# Patient Record
Sex: Female | Born: 1970 | Race: Black or African American | Hispanic: No | Marital: Married | Smoking: Never smoker
Health system: Southern US, Community
[De-identification: ages and names within clinical notes are randomized; demographics above are authoritative.]

## PROBLEM LIST (undated history)

## (undated) DIAGNOSIS — R0602 Shortness of breath: Secondary | ICD-10-CM

## (undated) DIAGNOSIS — J45909 Unspecified asthma, uncomplicated: Secondary | ICD-10-CM

## (undated) DIAGNOSIS — C801 Malignant (primary) neoplasm, unspecified: Secondary | ICD-10-CM

## (undated) DIAGNOSIS — I10 Essential (primary) hypertension: Secondary | ICD-10-CM

## (undated) HISTORY — PX: CHOLECYSTECTOMY: SHX55

## (undated) HISTORY — PX: TUBAL LIGATION: SHX77

---

## 1998-02-02 ENCOUNTER — Encounter: Admission: RE | Admit: 1998-02-02 | Discharge: 1998-02-02 | Payer: Self-pay | Admitting: Family Medicine

## 1998-02-15 ENCOUNTER — Encounter: Admission: RE | Admit: 1998-02-15 | Discharge: 1998-02-15 | Payer: Self-pay | Admitting: Family Medicine

## 1999-04-14 ENCOUNTER — Emergency Department (HOSPITAL_COMMUNITY): Admission: EM | Admit: 1999-04-14 | Discharge: 1999-04-14 | Payer: Self-pay | Admitting: Emergency Medicine

## 1999-08-01 ENCOUNTER — Encounter: Admission: RE | Admit: 1999-08-01 | Discharge: 1999-08-01 | Payer: Self-pay | Admitting: Family Medicine

## 1999-08-09 ENCOUNTER — Encounter: Admission: RE | Admit: 1999-08-09 | Discharge: 1999-08-09 | Payer: Self-pay | Admitting: Family Medicine

## 1999-08-09 ENCOUNTER — Other Ambulatory Visit: Admission: RE | Admit: 1999-08-09 | Discharge: 1999-08-09 | Payer: Self-pay | Admitting: Family Medicine

## 2000-01-08 ENCOUNTER — Emergency Department (HOSPITAL_COMMUNITY): Admission: EM | Admit: 2000-01-08 | Discharge: 2000-01-08 | Payer: Self-pay | Admitting: Emergency Medicine

## 2000-01-08 ENCOUNTER — Encounter: Payer: Self-pay | Admitting: Emergency Medicine

## 2001-01-31 ENCOUNTER — Emergency Department (HOSPITAL_COMMUNITY): Admission: EM | Admit: 2001-01-31 | Discharge: 2001-01-31 | Payer: Self-pay | Admitting: Emergency Medicine

## 2002-05-19 DIAGNOSIS — C801 Malignant (primary) neoplasm, unspecified: Secondary | ICD-10-CM

## 2002-05-19 HISTORY — DX: Malignant (primary) neoplasm, unspecified: C80.1

## 2003-02-14 ENCOUNTER — Emergency Department (HOSPITAL_COMMUNITY): Admission: EM | Admit: 2003-02-14 | Discharge: 2003-02-14 | Payer: Self-pay | Admitting: Emergency Medicine

## 2003-07-05 ENCOUNTER — Encounter: Payer: Self-pay | Admitting: Cardiology

## 2003-07-05 ENCOUNTER — Ambulatory Visit (HOSPITAL_COMMUNITY): Admission: RE | Admit: 2003-07-05 | Discharge: 2003-07-05 | Payer: Self-pay | Admitting: Family Medicine

## 2007-10-26 ENCOUNTER — Emergency Department (HOSPITAL_COMMUNITY): Admission: EM | Admit: 2007-10-26 | Discharge: 2007-10-26 | Payer: Self-pay | Admitting: Emergency Medicine

## 2007-11-07 ENCOUNTER — Emergency Department (HOSPITAL_COMMUNITY): Admission: EM | Admit: 2007-11-07 | Discharge: 2007-11-07 | Payer: Self-pay | Admitting: Emergency Medicine

## 2008-02-18 ENCOUNTER — Emergency Department (HOSPITAL_COMMUNITY): Admission: EM | Admit: 2008-02-18 | Discharge: 2008-02-18 | Payer: Self-pay | Admitting: Emergency Medicine

## 2009-02-17 ENCOUNTER — Emergency Department (HOSPITAL_COMMUNITY): Admission: EM | Admit: 2009-02-17 | Discharge: 2009-02-17 | Payer: Self-pay | Admitting: Emergency Medicine

## 2009-02-19 ENCOUNTER — Emergency Department (HOSPITAL_COMMUNITY): Admission: EM | Admit: 2009-02-19 | Discharge: 2009-02-19 | Payer: Self-pay | Admitting: Family Medicine

## 2009-03-06 ENCOUNTER — Emergency Department (HOSPITAL_COMMUNITY): Admission: EM | Admit: 2009-03-06 | Discharge: 2009-03-06 | Payer: Self-pay | Admitting: Emergency Medicine

## 2009-04-29 ENCOUNTER — Emergency Department (HOSPITAL_COMMUNITY): Admission: EM | Admit: 2009-04-29 | Discharge: 2009-04-29 | Payer: Self-pay | Admitting: Emergency Medicine

## 2011-02-13 LAB — RAPID STREP SCREEN (MED CTR MEBANE ONLY): Streptococcus, Group A Screen (Direct): NEGATIVE

## 2011-11-11 ENCOUNTER — Emergency Department (HOSPITAL_COMMUNITY)
Admission: EM | Admit: 2011-11-11 | Discharge: 2011-11-11 | Disposition: A | Discharge status: Home / Self Care | Payer: Self-pay | Attending: Emergency Medicine | Admitting: Emergency Medicine

## 2011-11-11 ENCOUNTER — Encounter (HOSPITAL_COMMUNITY): Payer: Self-pay | Admitting: Adult Health

## 2011-11-11 DIAGNOSIS — S46019A Strain of muscle(s) and tendon(s) of the rotator cuff of unspecified shoulder, initial encounter: Secondary | ICD-10-CM

## 2011-11-11 DIAGNOSIS — S43429A Sprain of unspecified rotator cuff capsule, initial encounter: Secondary | ICD-10-CM | POA: Insufficient documentation

## 2011-11-11 DIAGNOSIS — Y9389 Activity, other specified: Secondary | ICD-10-CM | POA: Insufficient documentation

## 2011-11-11 DIAGNOSIS — Y998 Other external cause status: Secondary | ICD-10-CM | POA: Insufficient documentation

## 2011-11-11 DIAGNOSIS — S46819A Strain of other muscles, fascia and tendons at shoulder and upper arm level, unspecified arm, initial encounter: Secondary | ICD-10-CM | POA: Insufficient documentation

## 2011-11-11 DIAGNOSIS — X58XXXA Exposure to other specified factors, initial encounter: Secondary | ICD-10-CM | POA: Insufficient documentation

## 2011-11-11 MED ORDER — NAPROXEN 500 MG PO TABS: 500.0000 mg | ORAL_TABLET | Freq: Two times a day (BID) | ORAL | Status: DC

## 2011-11-11 NOTE — ED Provider Notes (Signed)
History     CSN: [id number]  Arrival date & time 11/11/11  1925   First MD Initiated Contact with Patient 11/11/11 2057      Chief Complaint  Patient presents with  . Shoulder Pain   HPI  History provided by the patient. Patient is a 41 year old female with no significant past medical history who presents with complaints of right shoulder pain. Patient states that pain began last Tuesday acutely after reaching forward for a bottle. Patient states she felt a slight pop at that time. Pain has been waxing and waning and is worse with movements. Patient has been trying to rest shoulder and put heat over the area. She has slight improvement of symptoms on Sunday but then has had return of pain. Pain occasionally radiates down upper arm towards the elbow area. She denies any numbness or weakness in the arm. Patient denies any previous injuries to the shoulder. Patient denies any neck pain or injury. Symptoms are described as moderate to severe. Patient denies any other associated symptoms. She denies any other aggravating or alleviating factors.    History reviewed. No pertinent past medical history.  History reviewed. No pertinent past surgical history.  History reviewed. No pertinent family history.  History  Substance Use Topics  . Smoking status: Never Smoker   . Smokeless tobacco: Not on file  . Alcohol Use: No    OB History    Grav Para Term Preterm Abortions TAB SAB Ect Mult Living                  Review of Systems  HENT: Negative for neck pain.   Musculoskeletal:       Right shoulder pain   Neurological: Negative for weakness, numbness and headaches.    Allergies  Review of patient's allergies indicates no known allergies.  Home Medications  No current outpatient prescriptions on file.  BP 166/107  Pulse 85  Temp 98.6 F (37 C) (Oral)  Resp 16  SpO2 100%  Physical Exam  Nursing note and vitals reviewed. Constitutional: She is oriented to person, place,  and time. She appears well-developed and well-nourished. No distress.  HENT:  Head: Normocephalic.  Neck: Normal range of motion. Neck supple.       No cervical midline tenderness.  Cardiovascular: Normal rate and regular rhythm.   Pulmonary/Chest: Effort normal and breath sounds normal.  Musculoskeletal: She exhibits tenderness. She exhibits no edema.       Tenderness along the right trapezius and supraspinatus muscle. Mild tenderness over a.c. joint. No deformities or pain along clavicle. Pain with abduction of shoulder. Normal flexion and extension.  Normal distal radial pulses, strength and forearm and hand, normal sensation in fingers.  Neurological: She is alert and oriented to person, place, and time.  Skin: Skin is warm and dry. No erythema.  Psychiatric: She has a normal mood and affect. Her behavior is normal.    ED Course  Procedures     1. Rotator cuff strain   2. Supraspinatus sprain       MDM  10:40 PM patient seen and evaluated. Patient no acute distress.        Angus Seller, [place] 11/12/11 [phone]

## 2011-11-11 NOTE — Discharge Instructions (Signed)
You were seen and evaluated today for your right shoulder pains. Your providers today feel that you have muscle and tendon strain in your shoulder. Please use rest, ice, compression and elevation to help reduce pain and swelling symptoms. Use ibuprofen or Aleve to help with pain and inflammation. Please followup to primary care provider or orthopedic specialist for continued evaluation and treatment.    Muscle Strain A muscle strain (pulled muscle) happens when a muscle is over-stretched. Recovery usually takes 5 to 6 weeks.  HOME CARE   Put ice on the injured area.   Put ice in a plastic bag.   Place a towel between your skin and the bag.   Leave the ice on for 15 to 20 minutes at a time, every hour for the first 2 days.   Do not use the muscle for several days or until your doctor says you can. Do not use the muscle if you have pain.   Wrap the injured area with an elastic bandage for comfort. Do not put it on too tightly.   Only take medicine as told by your doctor.   Warm up before exercise. This helps prevent muscle strains.  GET HELP RIGHT AWAY IF:  There is increased pain or puffiness (swelling) in the affected area. MAKE SURE YOU:   Understand these instructions.   Will watch your condition.   Will get help right away if you are not doing well or get worse.  Document Released: 02/12/2008 Document Revised: 04/24/2011 Document Reviewed: 02/12/2008 [facility] Patient Information 2012 [place], [place].    Rotator Cuff Tendonitis  The rotator cuff is the collection of all the muscles and tendons (the supraspinatus, infraspinatus, subscapularis, and teres minor muscles and their tendons) that help your shoulder stay in place. This unit holds the head of the upper arm bone (humerus) in the cup (fossa) of the shoulder blade (scapula). Basically, it connects the arm to the shoulder. Tendinitis is a swelling and irritation of the tissue, called cord like structures (tendons) that  connect muscle to bone. It usually is caused by overusing the joint involved. When the tissue surrounding a tendon (the synovium) becomes inflamed, it is called tenosynovitis. This also is often the result of overuse in people whose jobs require repetitive (over and over again) types of motion. HOME CARE INSTRUCTIONS   Use a sling or splint for as long as directed by your caregiver until the pain decreases.   Apply ice to the injury for 15 to 20 minutes, 3 to 4 times per day. Put the ice in a plastic bag and place a towel between the bag of ice and your skin.   Try to avoid use other than gentle range of motion while your shoulder is painful. Use and exercise only as directed by your caregiver. Stop exercises or range of motion if pain or discomfort increases, unless directed otherwise by your caregiver.   Only take over-the-counter or prescription medicines for pain, discomfort, or fever as directed by your caregiver.   If you were give a shoulder sling and straps (immobilizer), do not remove it except as directed, or until you see a caregiver for a follow-up examination. If you need to remove it, move your arm as little as possible or as directed.   You may want to sleep on several pillows at night to lessen swelling and pain.  SEEK IMMEDIATE MEDICAL CARE IF:   Pain in your shoulder increases or new pain develops in your arm, hand, or  fingers and is not relieved with medications.   You develop new, unexplained symptoms, especially increased numbness in the hands or loss of strength, or you develop any worsening of the problems which brought you in for care.   Your arm, hand, or fingers are numb or tingling.   Your arm, hand, or fingers are swollen, painful, or turn white or blue.  Document Released: 07/26/2003 Document Revised: 04/24/2011 Document Reviewed: 03/02/2008 [facility] Patient Information 2012 [place], [place].

## 2011-11-11 NOTE — ED Notes (Signed)
C/o right shoulder pain that radiates down to elbow and is worse with movement, began last Tuesday and eased off on Friday then came back on Sunday. 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 Pain is better with rest and propping arm up. Pain is intermittent and described as sharp. CMS intact.

## 2011-11-12 NOTE — ED Provider Notes (Signed)
Medical screening examination/treatment/procedure(s) were performed by non-physician practitioner and as supervising physician I was immediately available for consultation/collaboration.   Krimson Massmann, MD 11/12/11 1538 

## 2012-01-31 ENCOUNTER — Emergency Department (HOSPITAL_COMMUNITY)
Admission: EM | Admit: 2012-01-31 | Discharge: 2012-01-31 | Disposition: A | Discharge status: Home / Self Care | Payer: Self-pay | Attending: Emergency Medicine | Admitting: Emergency Medicine

## 2012-01-31 ENCOUNTER — Encounter (HOSPITAL_COMMUNITY): Payer: Self-pay

## 2012-01-31 DIAGNOSIS — R21 Rash and other nonspecific skin eruption: Secondary | ICD-10-CM | POA: Insufficient documentation

## 2012-01-31 MED ORDER — HYDROCORTISONE 1 % EX CREA: TOPICAL_CREAM | CUTANEOUS | Status: DC

## 2012-01-31 NOTE — ED Provider Notes (Signed)
History  Scribed for Ethelda Chick, MD, the patient was seen in room TR11C/TR11C. This chart was scribed by Candelaria Stagers. The patient's care started at 2:26 PM   CSN: [id number]  Arrival date & time 01/31/12  1230   First MD Initiated Contact with Patient 01/31/12 1359      Chief Complaint  Patient presents with  . Rash     The history is provided by the patient. No language interpreter was used.   Dawn Mitchell is a 41 y.o. female who presents to the Emergency Department complaining of a rash to the right side of her neck that started about one month ago and has recently gotten worse and is now painful.  She reports when the rash first started it itched.  Nothing seems to make the rash better or worse. She has tried vaseline and rubbing alcohol.  No fever, no overlying redness, no pus draining.  She denies any new exposures.  There are no other associated systemic symptoms, there are no other alleviating or modifying factors.   No past medical history on file.  No past surgical history on file.  No family history on file.  History  Substance Use Topics  . Smoking status: Never Smoker   . Smokeless tobacco: Not on file  . Alcohol Use: No    OB History    Grav Para Term Preterm Abortions TAB SAB Ect Mult Living                  Review of Systems  Skin: Positive for rash (burning, painful rash to right side of neck).  All other systems reviewed and are negative.    Allergies  Review of patient's allergies indicates no known allergies.  Home Medications   Current Outpatient Rx  Name Route Sig Dispense Refill  . HYDROCORTISONE 1 % EX CREA  Apply to affected area 3 times daily 15 g 0    BP 171/105  Pulse 88  Temp 98.4 F (36.9 C) (Oral)  Resp 16  SpO2 100%  Physical Exam  Nursing note and vitals reviewed. Constitutional: She is oriented to person, place, and time. She appears well-developed and well-nourished. No distress.  HENT:  Head: Normocephalic  and atraumatic.  Eyes: EOM are normal. Pupils are equal, round, and reactive to light.  Neck: Neck supple. No tracheal deviation present.  Pulmonary/Chest: Effort normal. No respiratory distress.  Musculoskeletal: Normal range of motion. She exhibits no edema.  Neurological: She is alert and oriented to person, place, and time.  Skin: Rash noted.       5 cm area on right neck that is dry with flesh colored papules.  No erythema, no vesicles.   Psychiatric: She has a normal mood and affect. Her behavior is normal.    ED Course  Procedures   DIAGNOSTIC STUDIES: Oxygen Saturation is 100% on room air, normal by my interpretation.    COORDINATION OF CARE:     Labs Reviewed - No data to display No results found.   1. Rash       MDM  Pt presenting with rash overlying the right side of her neck- rash looks most c/w exzcema- does not have vesicles or pustules, no overlying erythema to suggest infection.  Not c/w zoster rash.  Advised hydrocortisone cream.  Pt is overall nontoxic and well hydrated in appearance.  Discharged with strict return precautions.  Pt agreeable with plan.  I personally performed the services described in this documentation, which  was scribed in my presence. The recorded information has been reviewed and considered.        Ethelda Chick, MD 02/01/12 1224

## 2012-01-31 NOTE — ED Notes (Signed)
Pt complains of rash to neck on right side x 1 month, sts she thinks it is shingles or heat bumps. Burns and itches.

## 2012-02-21 ENCOUNTER — Emergency Department (HOSPITAL_COMMUNITY)
Admission: EM | Admit: 2012-02-21 | Discharge: 2012-02-21 | Disposition: A | Discharge status: Home / Self Care | Payer: Self-pay | Attending: Emergency Medicine | Admitting: Emergency Medicine

## 2012-02-21 ENCOUNTER — Encounter (HOSPITAL_COMMUNITY): Payer: Self-pay | Admitting: Nurse Practitioner

## 2012-02-21 DIAGNOSIS — H60399 Other infective otitis externa, unspecified ear: Secondary | ICD-10-CM | POA: Insufficient documentation

## 2012-02-21 DIAGNOSIS — H6000 Abscess of external ear, unspecified ear: Secondary | ICD-10-CM

## 2012-02-21 MED ORDER — SULFAMETHOXAZOLE-TRIMETHOPRIM 800-160 MG PO TABS: 1.0000 | ORAL_TABLET | Freq: Two times a day (BID) | ORAL | Status: DC

## 2012-02-21 MED ORDER — CEPHALEXIN 500 MG PO CAPS: 500.0000 mg | ORAL_CAPSULE | Freq: Four times a day (QID) | ORAL | Status: DC

## 2012-02-21 MED ORDER — ACETIC ACID 2 % OT SOLN: 4.0000 [drp] | Freq: Three times a day (TID) | OTIC | Status: DC

## 2012-02-21 MED ORDER — HYDROCODONE-ACETAMINOPHEN 5-325 MG PO TABS: 1.0000 | ORAL_TABLET | ORAL | Status: DC | PRN

## 2012-02-21 MED ORDER — HYDROCHLOROTHIAZIDE 25 MG PO TABS: 25.0000 mg | ORAL_TABLET | Freq: Every day | ORAL | Status: DC

## 2012-02-21 NOTE — ED Provider Notes (Signed)
History     CSN: [id number]  Arrival date & time 02/21/12  1802   First MD Initiated Contact with Patient 02/21/12 2133      Chief Complaint  Patient presents with  . Otalgia    (Consider location/radiation/quality/duration/timing/severity/associated sxs/prior treatment) HPI History provided by pt.   Pt presents w/ severe left inner ear pain x 3 days.  Has noticed a small amt of purulent/bloody drainage.  No associated fever, nasal congestion, rhinorrhea, sore throat or cough.  Denies trauma.  Has been using otc analgesic ear drops w/out relief.  No PMH.    History reviewed. No pertinent past medical history.  History reviewed. No pertinent past surgical history.  History reviewed. No pertinent family history.  History  Substance Use Topics  . Smoking status: Never Smoker   . Smokeless tobacco: Not on file  . Alcohol Use: No    OB History    Grav Para Term Preterm Abortions TAB SAB Ect Mult Living                  Review of Systems  All other systems reviewed and are negative.    Allergies  Review of patient's allergies indicates no known allergies.  Home Medications   Current Outpatient Rx  Name Route Sig Dispense Refill  . HYDROCORTISONE 1 % EX CREA  Apply to affected area 3 times daily 15 g 0    BP 177/102  Pulse 92  Temp 98.4 F (36.9 C) (Oral)  Resp 16  SpO2 100%  Physical Exam  Nursing note and vitals reviewed. Constitutional: She is oriented to person, place, and time. She appears well-developed and well-nourished. No distress.  HENT:  Head: Normocephalic and atraumatic.       No tenderness, erythema or edema of right mastoid.  Mild tenderness posterior pinna.  There is an approx 1cm diameter abscess at most outer and posterior aspect of external auditory canal.  No visible active drainage.  Ttp.  The rest of inner ear exam limited d/t abscess but there is no obvious edema, erythema or drainage in the rest of canal.  TM appears normal.    Eyes:         Normal appearance  Neck: Normal range of motion.  Cardiovascular: Normal rate and regular rhythm.        hypertensive  Pulmonary/Chest: Effort normal and breath sounds normal. No respiratory distress.  Musculoskeletal: Normal range of motion.  Lymphadenopathy:    She has no cervical adenopathy.  Neurological: She is alert and oriented to person, place, and time.  Skin: Skin is warm and dry. No rash noted.  Psychiatric: She has a normal mood and affect. Her behavior is normal.    ED Course  Procedures (including critical care time)  Labs Reviewed - No data to display No results found.   1. Abscess of ear canal       MDM  Healthy 41yo F presents w/ c/o right ear pain.  Abscess at outermost portion of external auditory canal on exam.  No sign of mastoiditis.  I am not going to attempt I&D because it will be difficulty to access and I am concerned that I could injure patient if she should move.  Will prescribed keflex/bactrim as well as acetic acid otic solution (rest of ear exam limited and otitis externa unlikely but possible) and refer to ENT for worsening/refractory sx.  I have also prescribed her HCTZ for new onset HTN and referred to PCP.  She understands  the importance of BP control.          Arie Sabina [place], [place] 02/21/12 2203

## 2012-02-21 NOTE — ED Notes (Signed)
C/o R earache for past days. Trying OTC ear drops with no relief. Denies any other symptoms or complaints

## 2012-02-23 NOTE — ED Provider Notes (Signed)
Medical screening examination/treatment/procedure(s) were performed by non-physician practitioner and as supervising physician I was immediately available for consultation/collaboration.   Richardean Canal, MD 02/23/12 [phone]

## 2012-03-01 ENCOUNTER — Encounter (HOSPITAL_COMMUNITY): Payer: Self-pay | Admitting: Emergency Medicine

## 2012-03-01 ENCOUNTER — Emergency Department (HOSPITAL_COMMUNITY)
Admission: EM | Admit: 2012-03-01 | Discharge: 2012-03-01 | Disposition: A | Discharge status: Hospice | Payer: Self-pay | Attending: Emergency Medicine | Admitting: Emergency Medicine

## 2012-03-01 DIAGNOSIS — R110 Nausea: Secondary | ICD-10-CM | POA: Insufficient documentation

## 2012-03-01 DIAGNOSIS — Z79899 Other long term (current) drug therapy: Secondary | ICD-10-CM | POA: Insufficient documentation

## 2012-03-01 DIAGNOSIS — I10 Essential (primary) hypertension: Secondary | ICD-10-CM | POA: Insufficient documentation

## 2012-03-01 DIAGNOSIS — R10816 Epigastric abdominal tenderness: Secondary | ICD-10-CM | POA: Insufficient documentation

## 2012-03-01 DIAGNOSIS — R1013 Epigastric pain: Secondary | ICD-10-CM | POA: Insufficient documentation

## 2012-03-01 DIAGNOSIS — R51 Headache: Secondary | ICD-10-CM | POA: Insufficient documentation

## 2012-03-01 HISTORY — DX: Essential (primary) hypertension: I10

## 2012-03-01 LAB — BASIC METABOLIC PANEL
CO2: 28 mEq/L (ref 19–32)
Calcium: 10.7 mg/dL — ABNORMAL HIGH (ref 8.4–10.5)
Chloride: 97 mEq/L (ref 96–112)
Creatinine, Ser: 0.94 mg/dL (ref 0.50–1.10)
GFR calc Af Amer: 85 mL/min — ABNORMAL LOW (ref >90)
Sodium: 134 mEq/L — ABNORMAL LOW (ref 135–145)

## 2012-03-01 LAB — CBC WITH DIFFERENTIAL/PLATELET
Basophils Relative: 1 % (ref 0–1)
Eosinophils Absolute: 0.4 10*3/uL (ref 0.0–0.7)
Eosinophils Relative: 11 % — ABNORMAL HIGH (ref 0–5)
HCT: 32.1 % — ABNORMAL LOW (ref 36.0–46.0)
Hemoglobin: 9.7 g/dL — ABNORMAL LOW (ref 12.0–15.0)
Lymphocytes Relative: 44 % (ref 12–46)
Monocytes Relative: 15 % — ABNORMAL HIGH (ref 3–12)
Neutro Abs: 1 10*3/uL — ABNORMAL LOW (ref 1.7–7.7)
Neutrophils Relative %: 29 % — ABNORMAL LOW (ref 43–77)
RBC: 4.67 MIL/uL (ref 3.87–5.11)
Smear Review: DECREASED
WBC: 3.6 10*3/uL — ABNORMAL LOW (ref 4.0–10.5)

## 2012-03-01 LAB — URINALYSIS, ROUTINE W REFLEX MICROSCOPIC
Glucose, UA: NEGATIVE mg/dL
Ketones, ur: NEGATIVE mg/dL
Nitrite: NEGATIVE
Protein, ur: 30 mg/dL — AB
pH: 6 (ref 5.0–8.0)

## 2012-03-01 LAB — POCT PREGNANCY, URINE: Preg Test, Ur: NEGATIVE

## 2012-03-01 MED ORDER — FAMOTIDINE 20 MG PO TABS
20.0000 mg | ORAL_TABLET | Freq: Once | ORAL | Status: AC
  Administered 2012-03-01: 20 mg via ORAL
  Filled 2012-03-01: qty 1

## 2012-03-01 MED ORDER — OXYCODONE-ACETAMINOPHEN 5-325 MG PO TABS
1.0000 | ORAL_TABLET | Freq: Once | ORAL | Status: AC
  Administered 2012-03-01: 1 via ORAL
  Filled 2012-03-01: qty 1

## 2012-03-01 MED ORDER — FAMOTIDINE 40 MG PO TABS: 40.0000 mg | ORAL_TABLET | Freq: Every day | ORAL | Status: DC

## 2012-03-01 MED ORDER — HYDROCHLOROTHIAZIDE 25 MG PO TABS: 25.0000 mg | ORAL_TABLET | Freq: Every day | ORAL | Status: DC

## 2012-03-01 MED ORDER — OXYCODONE-ACETAMINOPHEN 5-325 MG PO TABS: ORAL_TABLET | ORAL | Status: DC

## 2012-03-01 MED ORDER — GI COCKTAIL ~~LOC~~
30.0000 mL | Freq: Once | ORAL | Status: AC
  Administered 2012-03-01: 30 mL via ORAL
  Filled 2012-03-01: qty 30

## 2012-03-01 MED ORDER — ONDANSETRON 4 MG PO TBDP
4.0000 mg | ORAL_TABLET | Freq: Once | ORAL | Status: AC
  Administered 2012-03-01: 4 mg via ORAL
  Filled 2012-03-01: qty 1

## 2012-03-01 NOTE — ED Notes (Signed)
Pt c/o upper abd pain with nausea x 4 days with generalized HA; pt sts stopped taking bp meds and antibiotics because she thought could be causing pain

## 2012-03-01 NOTE — ED Provider Notes (Signed)
Medical screening examination/treatment/procedure(s) were performed by non-physician practitioner and as supervising physician I was immediately available for consultation/collaboration.  Katheryn Culliton R. Leone Putman, MD 03/01/12 1541 

## 2012-03-01 NOTE — ED Provider Notes (Signed)
History     CSN: [id number]  Arrival date & time 03/01/12  1134   First MD Initiated Contact with Patient 03/01/12 1303      Chief Complaint  Patient presents with  . Abdominal Pain  . Headache    (Consider location/radiation/quality/duration/timing/severity/associated sxs/prior treatment) The history is provided by the patient.    41 y.o. female INAD 10/10 epigastric pain exacerbated by eating worsening over the course of 4 days. Patient reports mild frontal HA. nausea Denies decrease in by mouth intake fever, emesis, diarrhea, constipation, change in bladder habits. She self DC'd antibiotics and blood pressure medications because she assumed this was a side effect however symptoms have not improved since DC 4 days ago.  Past Medical History  Diagnosis Date  . Hypertension     History reviewed. No pertinent past surgical history.  History reviewed. No pertinent family history.  History  Substance Use Topics  . Smoking status: Never Smoker   . Smokeless tobacco: Not on file  . Alcohol Use: No    OB History    Grav Para Term Preterm Abortions TAB SAB Ect Mult Living                  Review of Systems  Constitutional: Negative for fever.  Respiratory: Negative for shortness of breath.   Cardiovascular: Negative for chest pain.  Gastrointestinal: Positive for nausea and abdominal pain. Negative for vomiting and diarrhea.  Neurological: Positive for headaches.  All other systems reviewed and are negative.    Allergies  Review of patient's allergies indicates no known allergies.  Home Medications   Current Outpatient Rx  Name Route Sig Dispense Refill  . ACETIC ACID 2 % OT SOLN Right Ear Place 4 drops into the right ear 3 (three) times daily. 15 mL 0  . HYDROCHLOROTHIAZIDE 25 MG PO TABS Oral Take 1 tablet (25 mg total) by mouth daily. 30 tablet 0  . HYDROCODONE-ACETAMINOPHEN 5-325 MG PO TABS Oral Take 1 tablet by mouth every 4 (four) hours as needed. For  pain.    Marland Kitchen HYDROCORTISONE 1 % EX CREA Topical Apply 1 application topically daily.    . ADULT MULTIVITAMIN W/MINERALS CH Oral Take 1 tablet by mouth daily.    . CEPHALEXIN 500 MG PO CAPS Oral Take 500 mg by mouth 4 (four) times daily. 7 day treatment. Started 10.6.13    . SULFAMETHOXAZOLE-TRIMETHOPRIM 800-160 MG PO TABS Oral Take 1 tablet by mouth 2 (two) times daily. 7 day treatment. Started on 02/22/12.      BP 148/88  Pulse 94  Temp 98.5 F (36.9 C) (Oral)  Resp 20  SpO2 100%  Physical Exam  Nursing note and vitals reviewed. Constitutional: She is oriented to person, place, and time. She appears well-developed and well-nourished. No distress.  HENT:  Head: Normocephalic.  Mouth/Throat: Oropharynx is clear and moist.  Eyes: Conjunctivae normal and EOM are normal. Pupils are equal, round, and reactive to light.  Cardiovascular: Normal rate, regular rhythm, normal heart sounds and intact distal pulses.   Pulmonary/Chest: Effort normal and breath sounds normal. No stridor. No respiratory distress. She has no wheezes. She has no rales. She exhibits no tenderness.  Abdominal:       Very mild tenderness to palpation of the epigastrium. No rebound or peritoneal signs.  Musculoskeletal: Normal range of motion. She exhibits no edema and no tenderness.  Neurological: She is alert and oriented to person, place, and time.  Psychiatric: She has a normal mood and  affect.    ED Course  Procedures (including critical care time)  Labs Reviewed  CBC WITH DIFFERENTIAL - Abnormal; Notable for the following:    WBC 3.6 (*)     Hemoglobin 9.7 (*)     HCT 32.1 (*)     MCV 68.7 (*)     MCH 20.8 (*)     RDW 18.7 (*)     Platelets 132 (*)     All other components within normal limits  BASIC METABOLIC PANEL - Abnormal; Notable for the following:    Sodium 134 (*)     Calcium 10.7 (*)     GFR calc non Af Amer 73 (*)     GFR calc Af Amer 85 (*)     All other components within normal limits    URINALYSIS, ROUTINE W REFLEX MICROSCOPIC - Abnormal; Notable for the following:    APPearance CLOUDY (*)     Protein, ur 30 (*)     Leukocytes, UA MODERATE (*)     All other components within normal limits  URINE MICROSCOPIC-ADD ON - Abnormal; Notable for the following:    Squamous Epithelial / LPF MANY (*)     Bacteria, UA FEW (*)     All other components within normal limits  POCT PREGNANCY, URINE   No results found.   1. Epigastric pain       MDM  41 y.o. female with nausea and epigastric pain, and after eating. Likely gastric ulcer/gastritis, H/ Pylori. Abdominal is exam is benign with no peritoneal signs. Blood work is unremarkable urinalysis may she's show signs of infection however patient is asymptomatic.  Counseled patient on obtaining outpatient care, and the importance of continuing with her hypertension medications.   Pt verbalized understanding and agrees with care plan. Outpatient follow-up and return precautions given.     New Prescriptions   FAMOTIDINE (PEPCID) 40 MG TABLET    Take 1 tablet (40 mg total) by mouth daily.   HYDROCHLOROTHIAZIDE (HYDRODIURIL) 25 MG TABLET    Take 1 tablet (25 mg total) by mouth daily.   OXYCODONE-ACETAMINOPHEN (PERCOCET/ROXICET) 5-325 MG PER TABLET    1 to 2 tabs PO q6hrs  PRN for pain          Wynetta Emery, PA-C 03/01/12 [address], PA-C 03/01/12 1452

## 2012-09-03 DIAGNOSIS — N390 Urinary tract infection, site not specified: Secondary | ICD-10-CM | POA: Insufficient documentation

## 2012-09-03 DIAGNOSIS — R300 Dysuria: Secondary | ICD-10-CM | POA: Insufficient documentation

## 2012-09-03 DIAGNOSIS — R3915 Urgency of urination: Secondary | ICD-10-CM | POA: Insufficient documentation

## 2012-09-03 DIAGNOSIS — Z3202 Encounter for pregnancy test, result negative: Secondary | ICD-10-CM | POA: Insufficient documentation

## 2012-09-03 DIAGNOSIS — I10 Essential (primary) hypertension: Secondary | ICD-10-CM | POA: Insufficient documentation

## 2012-09-03 DIAGNOSIS — D649 Anemia, unspecified: Secondary | ICD-10-CM | POA: Insufficient documentation

## 2012-09-03 DIAGNOSIS — R1013 Epigastric pain: Secondary | ICD-10-CM | POA: Insufficient documentation

## 2012-09-04 ENCOUNTER — Encounter (HOSPITAL_COMMUNITY): Payer: Self-pay | Admitting: Emergency Medicine

## 2012-09-04 ENCOUNTER — Emergency Department (HOSPITAL_COMMUNITY)
Admission: EM | Admit: 2012-09-04 | Discharge: 2012-09-04 | Disposition: A | Discharge status: Home / Self Care | Payer: BC Managed Care – PPO | Attending: Emergency Medicine | Admitting: Emergency Medicine

## 2012-09-04 DIAGNOSIS — N390 Urinary tract infection, site not specified: Secondary | ICD-10-CM

## 2012-09-04 DIAGNOSIS — R1013 Epigastric pain: Secondary | ICD-10-CM

## 2012-09-04 DIAGNOSIS — D649 Anemia, unspecified: Secondary | ICD-10-CM

## 2012-09-04 LAB — URINALYSIS, ROUTINE W REFLEX MICROSCOPIC
Bilirubin Urine: NEGATIVE
Glucose, UA: NEGATIVE mg/dL
Ketones, ur: NEGATIVE mg/dL
Nitrite: NEGATIVE
Protein, ur: 100 mg/dL — AB
Specific Gravity, Urine: 1.016 (ref 1.005–1.030)
Urobilinogen, UA: 1 mg/dL (ref 0.0–1.0)
pH: 7.5 (ref 5.0–8.0)

## 2012-09-04 LAB — CBC WITH DIFFERENTIAL/PLATELET
Basophils Absolute: 0 10*3/uL (ref 0.0–0.1)
Basophils Relative: 0 % (ref 0–1)
Eosinophils Absolute: 0.2 K/uL (ref 0.0–0.7)
Eosinophils Relative: 2 % (ref 0–5)
HCT: 28.8 % — ABNORMAL LOW (ref 36.0–46.0)
Hemoglobin: 8.9 g/dL — ABNORMAL LOW (ref 12.0–15.0)
Lymphocytes Relative: 25 % (ref 12–46)
Lymphs Abs: 2 K/uL (ref 0.7–4.0)
MCH: 21 pg — ABNORMAL LOW (ref 26.0–34.0)
MCHC: 30.9 g/dL (ref 30.0–36.0)
MCV: 67.9 fL — ABNORMAL LOW (ref 78.0–100.0)
Monocytes Absolute: 0.6 K/uL (ref 0.1–1.0)
Monocytes Relative: 7 % (ref 3–12)
Neutro Abs: 5.3 10*3/uL (ref 1.7–7.7)
Neutrophils Relative %: 66 % (ref 43–77)
Platelets: 252 K/uL (ref 150–400)
RBC: 4.24 MIL/uL (ref 3.87–5.11)
RDW: 16.3 % — ABNORMAL HIGH (ref 11.5–15.5)
WBC: 8.1 10*3/uL (ref 4.0–10.5)

## 2012-09-04 LAB — URINE MICROSCOPIC-ADD ON

## 2012-09-04 LAB — COMPREHENSIVE METABOLIC PANEL
Albumin: 3.8 g/dL (ref 3.5–5.2)
Alkaline Phosphatase: 78 U/L (ref 39–117)
BUN: 9 mg/dL (ref 6–23)
CO2: 27 mEq/L (ref 19–32)
Chloride: 103 mEq/L (ref 96–112)
GFR calc non Af Amer: >90 mL/min (ref >90)
Glucose, Bld: 103 mg/dL — ABNORMAL HIGH (ref 70–99)
Potassium: 3.3 mEq/L — ABNORMAL LOW (ref 3.5–5.1)
Total Bilirubin: 0.3 mg/dL (ref 0.3–1.2)

## 2012-09-04 LAB — COMPREHENSIVE METABOLIC PANEL WITH GFR
ALT: 11 U/L (ref 0–35)
AST: 14 U/L (ref 0–37)
Calcium: 9.8 mg/dL (ref 8.4–10.5)
Creatinine, Ser: 0.7 mg/dL (ref 0.50–1.10)
GFR calc Af Amer: >90 mL/min (ref >90)
Sodium: 138 meq/L (ref 135–145)
Total Protein: 8 g/dL (ref 6.0–8.3)

## 2012-09-04 LAB — POCT PREGNANCY, URINE: Preg Test, Ur: NEGATIVE

## 2012-09-04 LAB — OCCULT BLOOD, POC DEVICE: Fecal Occult Bld: NEGATIVE

## 2012-09-04 MED ORDER — CIPROFLOXACIN HCL 500 MG PO TABS: 500.0000 mg | ORAL_TABLET | Freq: Two times a day (BID) | ORAL | Status: DC

## 2012-09-04 MED ORDER — PANTOPRAZOLE SODIUM 40 MG PO TBEC: 40.0000 mg | DELAYED_RELEASE_TABLET | Freq: Every day | ORAL | Status: AC

## 2012-09-04 NOTE — ED Provider Notes (Signed)
History     CSN: [id number]  Arrival date & time 09/03/12  2358   First MD Initiated Contact with Patient 09/04/12 0448      Chief Complaint  Patient presents with  . Abdominal Pain    (Consider location/radiation/quality/duration/timing/severity/associated sxs/prior treatment) HPI Comments: Patient reports she has a history of a urinary tract infection, has had some lower abdominal discomfort, dysuria and urgency similar to prior episodes for the last 3 days. She denies any flank or back pain, nausea vomiting or fevers. She also tonight had intermittent severe crampy discomfort in her upper abdomen which currently is resolved. Patient was seen previously for the same symptoms, told that she may have reflux or early ulcer disease was put on medication and told that she needed to "coat her stomach." She has been using Pepto-Bismol periodically or taking milk of magnesia. Yesterday she noticed some black stools. She denies frank diarrhea or constipation. She denies any chest pain, cough, shortness of breath. No obvious sick contacts. She reports that she did gain insurance recently, has her first appointment with [facility] physicians, Dr. Wynelle Link on Monday.  Patient is a 42 y.o. female presenting with abdominal pain. The history is provided by the patient.  Abdominal Pain Associated symptoms: no chills and no fever     Past Medical History  Diagnosis Date  . Hypertension     History reviewed. No pertinent past surgical history.  History reviewed. No pertinent family history.  History  Substance Use Topics  . Smoking status: Never Smoker   . Smokeless tobacco: Not on file  . Alcohol Use: No    OB History   Grav Para Term Preterm Abortions TAB SAB Ect Mult Living                  Review of Systems  Constitutional: Negative for fever, chills and appetite change.  Gastrointestinal: Positive for abdominal pain.  All other systems reviewed and are negative.    Allergies  Review of  patient's allergies indicates no known allergies.  Home Medications   Current Outpatient Rx  Name  Route  Sig  Dispense  Refill  . bismuth subsalicylate (PEPTO BISMOL) 262 MG/15ML suspension   Oral   Take 30 mLs by mouth every 6 (six) hours as needed for indigestion.         . hydrochlorothiazide (HYDRODIURIL) 25 MG tablet   Oral   Take 1 tablet (25 mg total) by mouth daily.   30 tablet   3   . magnesium hydroxide (MILK OF MAGNESIA) 800 MG/5ML suspension   Oral   Take 30 mLs by mouth daily as needed for constipation or heartburn.         . ciprofloxacin (CIPRO) 500 MG tablet   Oral   Take 1 tablet (500 mg total) by mouth every 12 (twelve) hours.   10 tablet   0   . pantoprazole (PROTONIX) 40 MG tablet   Oral   Take 1 tablet (40 mg total) by mouth daily.   14 tablet   0     BP 149/93  Pulse 82  Temp(Src) 97.9 F (36.6 C) (Oral)  Resp 16  SpO2 99%  LMP 08/28/2012  Physical Exam  Nursing note and vitals reviewed. Constitutional: She appears well-developed and well-nourished. No distress.  HENT:  Head: Normocephalic and atraumatic.  Eyes: Conjunctivae and EOM are normal. No scleral icterus.  Cardiovascular: Normal rate and intact distal pulses.   Pulmonary/Chest: Effort normal and breath sounds normal.  No respiratory distress.  Abdominal: Soft. She exhibits no distension. There is no tenderness. There is no rebound, no guarding and no CVA tenderness.  Genitourinary: Rectal exam shows no external hemorrhoid, no tenderness and anal tone normal. Guaiac negative stool. Pelvic exam was performed with patient prone.  Chaperone present  Neurological: She is alert.  Skin: Skin is warm. No rash noted.    ED Course  Procedures (including critical care time)  Labs Reviewed  URINALYSIS, ROUTINE W REFLEX MICROSCOPIC - Abnormal; Notable for the following:    APPearance CLOUDY (*)    Hgb urine dipstick MODERATE (*)    Protein, ur 100 (*)    Leukocytes, UA MODERATE  (*)    All other components within normal limits  CBC WITH DIFFERENTIAL - Abnormal; Notable for the following:    Hemoglobin 8.9 (*)    HCT 28.8 (*)    MCV 67.9 (*)    MCH 21.0 (*)    RDW 16.3 (*)    All other components within normal limits  COMPREHENSIVE METABOLIC PANEL - Abnormal; Notable for the following:    Potassium 3.3 (*)    Glucose, Bld 103 (*)    All other components within normal limits  URINE CULTURE  URINE MICROSCOPIC-ADD ON  POCT PREGNANCY, URINE  OCCULT BLOOD, POC DEVICE   No results found.   1. Epigastric abdominal pain   2. UTI (lower urinary tract infection)   3. Anemia     Reverse saturation is 99% and I interpret this to be normal  MDM   Patient with soft, nontender abdomen here on examination. Patient is anemic, stable compared to last visit. Electrolytes are within normal limits except for minimally increased potassium at 3.3. Evidence of urinary tract infection is present along with her symptoms of dysuria and lower abdominal discomfort, will treat her with 5 days of by mouth Cipro. Patient has new appointment with Dr. Wynelle Link on Monday. I will order a outpatient ultrasound of her abdomen to assess her biliary tree given her episodic upper abdominal discomfort. There is no surgical abdomen here today, white count is normal, no elevation of her LFTs. I feel outpatient evaluation for this is reasonable. I will also give her a prescription for Protonix. Patient is agreeable with plan.        Gavin Pound. Oletta Lamas, MD 09/04/12 [phone]

## 2012-09-04 NOTE — ED Notes (Signed)
Patient says she has been having abdominal pain for about two weeks.  Usually she is able to "coat" it and it goes away.This time she took [employer] and it did not do anything.  When she first arrived in the ED, her pain was 10/10, now it is a 5/10.

## 2012-09-04 NOTE — ED Notes (Signed)
Patient is alert and orientedx4.  Patient was explained discharge instructions and they understood them with no questions.  The patient's husband, Loriann Bosserman. is taking the patient home.

## 2012-09-04 NOTE — ED Notes (Signed)
Patient was able to dress herself, and walk out to the front of the ED to meet her husband.  The patient was smiling and did not appear in any pain or distress.

## 2012-09-04 NOTE — ED Notes (Addendum)
Pt st's she started having symptoms of urinary track infection 3 days ago.  St's tonight started having pain in lower abd. Nausea without vomiting. Pt denies vag. Discharge.

## 2012-09-04 NOTE — Discharge Instructions (Signed)
Abdominal Pain (Nonspecific)  Your exam might not show the exact reason you have abdominal pain. Since there are many different causes of abdominal pain, another checkup and more tests may be needed. It is very important to follow up for lasting (persistent) or worsening symptoms. A possible cause of abdominal pain in any person who still has his or her appendix is acute appendicitis. Appendicitis is often hard to diagnose. Normal blood tests, urine tests, ultrasound, and CT scans do not completely rule out early appendicitis or other causes of abdominal pain. Sometimes, only the changes that happen over time will allow appendicitis and other causes of abdominal pain to be determined. Other potential problems that may require surgery may also take time to become more apparent. Because of this, it is important that you follow all of the instructions below.  HOME CARE INSTRUCTIONS    Rest as much as possible.   Do not eat solid food until your pain is gone.   While adults or children have pain: A diet of water, weak decaffeinated tea, broth or bouillon, gelatin, oral rehydration solutions (ORS), frozen ice pops, or ice chips may be helpful.   When pain is gone in adults or children: Start a light diet (dry toast, crackers, applesauce, or white rice). Increase the diet slowly as long as it does not bother you. Eat no dairy products (including cheese and eggs) and no spicy, fatty, fried, or high-fiber foods.   Use no alcohol, caffeine, or cigarettes.   Take your regular medicines unless your caregiver told you not to.   Take any prescribed medicine as directed.   Only take over-the-counter or prescription medicines for pain, discomfort, or fever as directed by your caregiver. Do not give aspirin to children.  If your caregiver has given you a follow-up appointment, it is very important to keep that appointment. Not keeping the appointment could result in a permanent injury and/or lasting (chronic) pain and/or  disability. If there is any problem keeping the appointment, you must call to reschedule.   SEEK IMMEDIATE MEDICAL CARE IF:    Your pain is not gone in 24 hours.   Your pain becomes worse, changes location, or feels different.   You or your child has an oral temperature above 102 F (38.9 C), not controlled by medicine.   Your baby is older than 3 months with a rectal temperature of 102 F (38.9 C) or higher.   Your baby is 45 months old or younger with a rectal temperature of 100.4 F (38 C) or higher.   You have shaking chills.   You keep throwing up (vomiting) or cannot drink liquids.   There is blood in your vomit or you see blood in your bowel movements.   Your bowel movements become dark or black.   You have frequent bowel movements.   Your bowel movements stop (become blocked) or you cannot pass gas.   You have bloody, frequent, or painful urination.   You have yellow discoloration in the skin or whites of the eyes.   Your stomach becomes bloated or bigger.   You have dizziness or fainting.   You have chest or back pain.  MAKE SURE YOU:    Understand these instructions.   Will watch your condition.   Will get help right away if you are not doing well or get worse.  Document Released: 05/05/2005 Document Revised: 07/28/2011 Document Reviewed: 04/02/2009  [facility] Patient Information 2013 [place].  Urinary Tract Infection  Urinary  tract infections (UTIs) can develop anywhere along your urinary tract. Your urinary tract is your body's drainage system for removing wastes and extra water. Your urinary tract includes two kidneys, two ureters, a bladder, and a urethra. Your kidneys are a pair of bean-shaped organs. Each kidney is about the size of your fist. They are located below your ribs, one on each side of your spine.  CAUSES  Infections are caused by microbes, which are microscopic organisms, including fungi, viruses, and bacteria. These organisms are so small that they can only be  seen through a microscope. Bacteria are the microbes that most commonly cause UTIs.  SYMPTOMS   Symptoms of UTIs may vary by age and gender of the patient and by the location of the infection. Symptoms in young women typically include a frequent and intense urge to urinate and a painful, burning feeling in the bladder or urethra during urination. Older women and men are more likely to be tired, shaky, and weak and have muscle aches and abdominal pain. A fever may mean the infection is in your kidneys. Other symptoms of a kidney infection include pain in your back or sides below the ribs, nausea, and vomiting.  DIAGNOSIS  To diagnose a UTI, your caregiver will ask you about your symptoms. Your caregiver also will ask to provide a urine sample. The urine sample will be tested for bacteria and white blood cells. White blood cells are made by your body to help fight infection.  TREATMENT   Typically, UTIs can be treated with medication. Because most UTIs are caused by a bacterial infection, they usually can be treated with the use of antibiotics. The choice of antibiotic and length of treatment depend on your symptoms and the type of bacteria causing your infection.  HOME CARE INSTRUCTIONS   If you were prescribed antibiotics, take them exactly as your caregiver instructs you. Finish the medication even if you feel better after you have only taken some of the medication.   Drink enough water and fluids to keep your urine clear or pale yellow.   Avoid caffeine, tea, and carbonated beverages. They tend to irritate your bladder.   Empty your bladder often. Avoid holding urine for long periods of time.   Empty your bladder before and after sexual intercourse.   After a bowel movement, women should cleanse from front to back. Use each tissue only once.  SEEK MEDICAL CARE IF:    You have back pain.   You develop a fever.   Your symptoms do not begin to resolve within 3 days.  SEEK IMMEDIATE MEDICAL CARE IF:    You  have severe back pain or lower abdominal pain.   You develop chills.   You have nausea or vomiting.   You have continued burning or discomfort with urination.  MAKE SURE YOU:    Understand these instructions.   Will watch your condition.   Will get help right away if you are not doing well or get worse.  Document Released: 02/12/2005 Document Revised: 11/04/2011 Document Reviewed: 06/13/2011  [facility] Patient Information 2013 [place], [place].

## 2012-09-06 LAB — URINE CULTURE: Colony Count: >=100000

## 2012-09-07 ENCOUNTER — Telehealth (HOSPITAL_COMMUNITY): Payer: Self-pay | Admitting: Emergency Medicine

## 2012-09-07 ENCOUNTER — Ambulatory Visit (HOSPITAL_COMMUNITY)
Admission: RE | Admit: 2012-09-07 | Discharge: 2012-09-07 | Disposition: A | Discharge status: Home / Self Care | Payer: BC Managed Care – PPO | Source: Ambulatory Visit | Attending: Emergency Medicine | Admitting: Emergency Medicine

## 2012-09-07 DIAGNOSIS — R109 Unspecified abdominal pain: Secondary | ICD-10-CM | POA: Insufficient documentation

## 2012-09-07 DIAGNOSIS — K8020 Calculus of gallbladder without cholecystitis without obstruction: Secondary | ICD-10-CM | POA: Insufficient documentation

## 2012-09-22 ENCOUNTER — Ambulatory Visit (INDEPENDENT_AMBULATORY_CARE_PROVIDER_SITE_OTHER): Payer: BC Managed Care – PPO | Admitting: Surgery

## 2012-09-22 ENCOUNTER — Encounter (HOSPITAL_COMMUNITY): Payer: Self-pay | Admitting: Pharmacy Technician

## 2012-09-22 ENCOUNTER — Encounter (INDEPENDENT_AMBULATORY_CARE_PROVIDER_SITE_OTHER): Payer: Self-pay | Admitting: Surgery

## 2012-09-22 VITALS — BP 146/110 | HR 104 | Temp 97.5°F | Ht 66.5 in | Wt 252.6 lb

## 2012-09-22 DIAGNOSIS — K7581 Nonalcoholic steatohepatitis (NASH): Secondary | ICD-10-CM | POA: Insufficient documentation

## 2012-09-22 DIAGNOSIS — K8010 Calculus of gallbladder with chronic cholecystitis without obstruction: Secondary | ICD-10-CM

## 2012-09-22 DIAGNOSIS — K7689 Other specified diseases of liver: Secondary | ICD-10-CM

## 2012-09-22 DIAGNOSIS — I10 Essential (primary) hypertension: Secondary | ICD-10-CM

## 2012-09-22 NOTE — Patient Instructions (Addendum)
See the Handout(s) we gave you.  Consider surgery.  Please call our office at [phone] if you wish to schedule surgery or if you have further questions / concerns.   Cholecystitis Cholecystitis is an inflammation of your gallbladder. It is usually caused by a buildup of gallstones or sludge (cholelithiasis) in your gallbladder. The gallbladder stores a fluid that helps digest fats (bile). Cholecystitis is serious and needs treatment right away.  CAUSES   Gallstones. Gallstones can block the tube that leads to your gallbladder, causing bile to build up. As bile builds up, the gallbladder becomes inflamed.  Bile duct problems, such as blockage from scarring or kinking.  Tumors. Tumors can stop bile from leaving your gallbladder correctly, causing bile to build up. As bile builds up, the gallbladder becomes inflamed. SYMPTOMS   Nausea.  Vomiting.  Abdominal pain, especially in the upper right area of your abdomen.  Abdominal tenderness or bloating.  Sweating.  Chills.  Fever.  Yellowing of the skin and the whites of the eyes (jaundice). DIAGNOSIS  Your caregiver may order blood tests to look for infection or gallbladder problems. Your caregiver may also order imaging tests, such as an ultrasound or computed tomography (CT) scan. Further tests may include a hepatobiliary iminodiacetic acid (HIDA) scan. This scan allows your caregiver to see your bile move from the liver to the gallbladder and to the small intestine. TREATMENT  A hospital stay is usually necessary to lessen the inflammation of your gallbladder. You may be required to not eat or drink (fast) for a certain amount of time. You may be given medicine to treat pain or an antibiotic medicine to treat an infection. Surgery may be needed to remove your gallbladder (cholecystectomy) once the inflammation has gone down. Surgery may be needed right away if you develop complications such as death of gallbladder tissue (gangrene)  or a tear (perforation) of the gallbladder.  HOME CARE INSTRUCTIONS  Home care will depend on your treatment. In general:  If you were given antibiotics, take them as directed. Finish them even if you start to feel better.  Only take over-the-counter or prescription medicines for pain, discomfort, or fever as directed by your caregiver.  Follow a low-fat diet until you see your caregiver again.  Keep all follow-up visits as directed by your caregiver. SEEK IMMEDIATE MEDICAL CARE IF:   Your pain is increasing and not controlled by medicines.  Your pain moves to another part of your abdomen or to your back.  You have a fever.  You have nausea and vomiting. MAKE SURE YOU:  Understand these instructions.  Will watch your condition.  Will get help right away if you are not doing well or get worse. Document Released: 05/05/2005 Document Revised: 07/28/2011 Document Reviewed: 03/21/2011 [facility] Patient Information 2013 [place], [place].  LAPAROSCOPIC SURGERY: POST OP INSTRUCTIONS  1. DIET: Follow a light bland diet the first 24 hours after arrival home, such as soup, liquids, crackers, etc.  Be sure to include lots of fluids daily.  Avoid fast food or heavy meals as your are more likely to get nauseated.  Eat a low fat the next few days after surgery.   2. Take your usually prescribed home medications unless otherwise directed. 3. PAIN CONTROL: a. Pain is best controlled by a usual combination of three different methods TOGETHER: i. Ice/Heat ii. Over the counter pain medication iii. Prescription pain medication b. Most patients will experience some swelling and bruising around the incisions.  Ice packs or  heating pads (30-60 minutes up to 6 times a day) will help. Use ice for the first few days to help decrease swelling and bruising, then switch to heat to help relax tight/sore spots and speed recovery.  Some people prefer to use ice alone, heat alone, alternating between ice & heat.   Experiment to what works for you.  Swelling and bruising can take several weeks to resolve.   c. It is helpful to take an over-the-counter pain medication regularly for the first few weeks.  Choose one of the following that works best for you: i. Naproxen (Aleve, etc)  Two 220mg  tabs twice a day ii. Ibuprofen (Advil, etc) Three 200mg  tabs four times a day (every meal & bedtime) iii. Acetaminophen (Tylenol, etc) 500-650mg  four times a day (every meal & bedtime) d. A  prescription for pain medication (such as oxycodone, hydrocodone, etc) should be given to you upon discharge.  Take your pain medication as prescribed.  i. If you are having problems/concerns with the prescription medicine (does not control pain, nausea, vomiting, rash, itching, etc), please call us [phone] to see if we need to switch you to a different pain medicine that will work better for you and/or control your side effect better. ii. If you need a refill on your pain medication, please contact your pharmacy.  They will contact our office to request authorization. Prescriptions will not be filled after 5 pm or on week-ends. 4. Avoid getting constipated.  Between the surgery and the pain medications, it is common to experience some constipation.  Increasing fluid intake and taking a fiber supplement (such as Metamucil, Citrucel, FiberCon, MiraLax, etc) 1-2 times a day regularly will usually help prevent this problem from occurring.  A mild laxative (prune juice, Milk of Magnesia, MiraLax, etc) should be taken according to package directions if there are no bowel movements after 48 hours.   5. Watch out for diarrhea.  If you have many loose bowel movements, simplify your diet to bland foods & liquids for a few days.  Stop any stool softeners and decrease your fiber supplement.  Switching to mild anti-diarrheal medications (Kayopectate, Pepto Bismol) can help.  If this worsens or does not improve, please call us. 6. Wash / shower every  day.  You may shower over the dressings as they are waterproof.  Continue to shower over incision(s) after the dressing is off. 7. Remove your waterproof bandages 5 days after surgery.  You may leave the incision open to air.  You may replace a dressing/Band-Aid to cover the incision for comfort if you wish.  8. ACTIVITIES as tolerated:   a. You may resume regular (light) daily activities beginning the next day-such as daily self-care, walking, climbing stairs-gradually increasing activities as tolerated.  If you can walk 30 minutes without difficulty, it is safe to try more intense activity such as jogging, treadmill, bicycling, low-impact aerobics, swimming, etc. b. Save the most intensive and strenuous activity for last such as sit-ups, heavy lifting, contact sports, etc  Refrain from any heavy lifting or straining until you are off narcotics for pain control.   c. DO NOT PUSH THROUGH PAIN.  Let pain be your guide: If it hurts to do something, don't do it.  Pain is your body warning you to avoid that activity for another week until the pain goes down. d. You may drive when you are no longer taking prescription pain medication, you can comfortably wear a seatbelt, and you can safely maneuver your  car and apply brakes. e. Bonita Quin may have sexual intercourse when it is comfortable.  9. FOLLOW UP in our office a. Please call [facility] at [phone] to set up an appointment to see your surgeon in the office for a follow-up appointment approximately 2-3 weeks after your surgery. b. Make sure that you call for this appointment the day you arrive home to insure a convenient appointment time. 10. IF YOU HAVE DISABILITY OR FAMILY LEAVE FORMS, BRING THEM TO THE OFFICE FOR PROCESSING.  DO NOT GIVE THEM TO YOUR DOCTOR.   WHEN TO CALL us [phone]: 1. Poor pain control 2. Reactions / problems with new medications (rash/itching, nausea, etc)  3. Fever over 101.5 F (38.5 C) 4. Inability to urinate 5. Nausea  and/or vomiting 6. Worsening swelling or bruising 7. Continued bleeding from incision. 8. Increased pain, redness, or drainage from the incision   The clinic staff is available to answer your questions during regular business hours (8:30am-5pm).  Please don't hesitate to call and ask to speak to one of our nurses for clinical concerns.   If you have a medical emergency, go to the nearest emergency room or call 911.  A surgeon from [facility] is always on call at the [facility], [place] [address], [place], [place]  [address] ? MAIN: [phone] ? TOLL FREE: [phone] ?  FAX [phone] [email or web address]  Managing Pain  Pain after surgery or related to activity is often due to strain/injury to muscle, tendon, nerves and/or incisions.  This pain is usually short-term and will improve in a few months.   Many people find it helpful to do the following things TOGETHER to help speed the process of healing and to get back to regular activity more quickly:  1. Avoid heavy physical activity a.  no lifting greater than 20 pounds b. Do not "push through" the pain.  Listen to your body and avoid positions and maneuvers than reproduce the pain c. Walking is okay as tolerated, but go slowly and stop when getting sore.  d. Remember: If it hurts to do it, then don't do it! 2. Take Anti-inflammatory medication  a. Take with food/snack around the clock for 1-2 weeks i. This helps the muscle and nerve tissues become less irritable and calm down faster b. Choose ONE of the following over-the-counter medications: i. Naproxen 220mg  tabs (ex. Aleve) 1-2 pills twice a day  ii. Ibuprofen 200mg  tabs (ex. Advil, Motrin) 3-4 pills with every meal and just before bedtime iii. Acetaminophen 500mg  tabs (Tylenol) 1-2 pills with every meal and just before bedtime 3. Use a Heating pad or Ice/Cold Pack a. 4-6 times a day b. May use warm  bath/hottub  or showers 4. Try Gentle Massage and/or Stretching  a. at the area of pain many times a day b. stop if you feel pain - do not overdo it  Try these steps together to help you body heal faster and avoid making things get worse.  Doing just one of these things may not be enough.    If you are not getting better after two weeks or are noticing you are getting worse, contact our office for further advice; we may need to re-evaluate you & see what other things we can do to help.

## 2012-09-22 NOTE — Progress Notes (Signed)
Subjective:     Patient ID: Dawn Mitchell, female   DOB: 01/14/1971, 42 y.o.   MRN: [id number]  HPI  Rayaan S Dudzinski  06/27/1970 [id number]  Patient Care Team: Vyvyan Y Sun, MD as PCP - General (Family Medicine)  This patient is a 42 y.o.female who presents today for surgical evaluation at the request of Dr Sun.   Reason for visit: Abdominal pains and gallstones.  Pleasant obese female with intermittent abdominal pains. She comes today with her husband.   Usually happens after eating.  Upper abdomen.  Epigastric usually worst location.  Attacks have been severe enough to make her go to the emergency room.  She will often get nauseated.  Almost of emesis but not quite.  No bloating.  He has been given PPI and acid medicines.  She has taken them 2-3 weeks at a time several times without much help.  Attacks more consistently after meals.  Now radiating to her right back as well.  She can walk a half mile to a quarter mile before stopping with shortness of breath.  Normally has a bowel movement every one to two days.  She recently est. With the primary care physician.  Ultrasound was done concerning for gallstones.  Because of that lack of improvement on latest round of proton pump inhibitor, she was sent to me by Dr. Sun for evaluation.  No worsening reflux while supine or bending over.  No belching.  No dysphagia to solids or liquids.  No jaundice.  Does not drink alcohol.  No pancreatitis.  Does not smoke.  No family history of bowel issues.  No personal nor family history of GI/colon cancer, inflammatory bowel disease, irritable bowel syndrome, allergy such as Celiac Sprue, dietary/dairy problems, colitis, ulcers nor gastritis.  No recent sick contacts/gastroenteritis.  No travel outside the country.  No changes in diet.    Patient Active Problem List   Diagnosis Date Noted  . Chronic cholecystitis with calculus 09/22/2012  . Steatohepatitis, nonalcoholic 09/22/2012  . Obesity, Class III,  BMI 40-49.9 (morbid obesity) 09/22/2012  . Hypertension     Past Medical History  Diagnosis Date  . Hypertension     History reviewed. No pertinent past surgical history.  History   Social History  . Marital Status: Married    Spouse Name: N/A    Number of Children: N/A  . Years of Education: N/A   Occupational History  . Not on file.   Social History Main Topics  . Smoking status: Never Smoker   . Smokeless tobacco: Not on file  . Alcohol Use: No  . Drug Use: No  . Sexually Active: Not on file   Other Topics Concern  . Not on file   Social History Narrative  . No narrative on file    Family History  Problem Relation Age of Onset  . Diabetes Mother   . Heart attack Mother   . Stroke Mother   . Hypertension Mother   . Hypertension Father   . Hypertension Sister   . Stroke Sister   . Cancer Paternal Grandmother     breast  . Hypertension Sister     Current Outpatient Prescriptions  Medication Sig Dispense Refill  . Multiple Vitamins-Minerals (MULTIVITAMIN WITH MINERALS) tablet Take 1 tablet by mouth daily.      . pantoprazole (PROTONIX) 40 MG tablet Take 1 tablet (40 mg total) by mouth daily.  14 tablet  0  . hydrochlorothiazide (HYDRODIURIL) 25 MG tablet Take 1   tablet (25 mg total) by mouth daily.  30 tablet  3  . magnesium hydroxide (MILK OF MAGNESIA) 800 MG/5ML suspension Take 30 mLs by mouth daily as needed for constipation or heartburn.       No current facility-administered medications for this visit.     No Known Allergies  BP 146/110  Pulse 104  Temp(Src) 97.5 F (36.4 C) (Temporal)  Ht 5' 6.5" (1.689 m)  Wt 252 lb 9.6 oz (114.579 kg)  BMI 40.16 kg/m2  SpO2 98%  LMP 08/28/2012  Us Abdomen Complete  09/07/2012  *RADIOLOGY REPORT*  Clinical Data:  42-year-old female with abdominal pain.  ABDOMINAL ULTRASOUND COMPLETE  Comparison:  None  Findings:  Gallbladder: At least three mobile gallstones are identified, the largest measuring 2 cm.   There is no evidence of gallbladder wall thickening, pericholecystic fluid or sonographic Murphy's sign to suggest acute cholecystitis.  Common Bile Duct:  There is no evidence of intrahepatic or extrahepatic biliary dilation. The CBD measures 4.7 mm in greatest diameter.  Liver: Mildly increased echogenicity of the liver likely represents mild fatty infiltration.  A cyst along the anterior right liver is present.  No other focal hepatic abnormalities are noted.  IVC:  Appears normal.  Pancreas:  Although the pancreas is difficult to visualize in its entirety, no focal pancreatic abnormality is identified.  Spleen:  Within normal limits in size and echotexture.  Right kidney:  The right kidney is normal in size and parenchymal echogenicity.  There is no evidence of solid mass, hydronephrosis or definite renal calculi.  The right kidney measures  cm.  Left kidney:  The left kidney is normal in size and parenchymal echogenicity.  There is no evidence of solid mass, hydronephrosis or definite renal calculi.   The left kidney measures  cm.  Abdominal Aorta:  No abdominal aortic aneurysm identified.  There is no evidence of ascites.  IMPRESSION: Cholelithiasis without evidence of acute cholecystitis.  Probable mild fatty infiltration of the liver.   Original Report Authenticated By: Jeffrey Hu, M.D.      Review of Systems  Constitutional: Negative for fever, chills, diaphoresis, activity change, appetite change and fatigue.  HENT: Negative for ear pain, sore throat, trouble swallowing, neck pain and ear discharge.   Eyes: Negative for photophobia, discharge and visual disturbance.  Respiratory: Negative for cough, choking, chest tightness, wheezing and stridor.   Cardiovascular: Negative for chest pain, palpitations and leg swelling.  Gastrointestinal: Positive for nausea and abdominal pain. Negative for vomiting, diarrhea, constipation, blood in stool, abdominal distention, anal bleeding and rectal pain.   Genitourinary: Negative for dysuria, frequency and difficulty urinating.  Musculoskeletal: Negative for myalgias and gait problem.  Skin: Negative for color change, pallor and rash.  Neurological: Negative for dizziness, speech difficulty, weakness and numbness.  Hematological: Negative for adenopathy.  Psychiatric/Behavioral: Negative for confusion and agitation. The patient is not nervous/anxious.        Objective:   Physical Exam  Constitutional: She is oriented to person, place, and time. She appears well-developed and well-nourished. No distress.  HENT:  Head: Normocephalic.  Mouth/Throat: Oropharynx is clear and moist. No oropharyngeal exudate.  Eyes: Conjunctivae and EOM are normal. Pupils are equal, round, and reactive to light. No scleral icterus.  Neck: Normal range of motion. Neck supple. No tracheal deviation present.  Cardiovascular: Normal rate, regular rhythm and intact distal pulses.   Pulmonary/Chest: Effort normal and breath sounds normal. No respiratory distress. She exhibits no tenderness.  Abdominal: Soft. She   exhibits no distension, no pulsatile liver and no mass. There is tenderness in the right upper quadrant and epigastric area. There is no rigidity, no rebound, no guarding, no CVA tenderness, no tenderness at McBurney's point and negative Murphy's sign. No hernia. Hernia confirmed negative in the right inguinal area and confirmed negative in the left inguinal area.  Mild abdominal discomfort.  Genitourinary: No vaginal discharge found.  Musculoskeletal: Normal range of motion. She exhibits no tenderness.  Lymphadenopathy:    She has no cervical adenopathy.       Right: No inguinal adenopathy present.       Left: No inguinal adenopathy present.  Neurological: She is alert and oriented to person, place, and time. No cranial nerve deficit. She exhibits normal muscle tone. Coordination normal.  Skin: Skin is warm and dry. No rash noted. She is not diaphoretic. No  erythema.  Psychiatric: She has a normal mood and affect. Her behavior is normal. Judgment and thought content normal.       Assessment:     Postprandial nausea and abdominal pain suspicious for chronic cholecystitis.  No improvement on PPIs argues against gastroesophageal reflux disease/gastritis     Plan:     I offered her options.  I think at this point, I am more convinced that the gallbladder as etiology of her symptoms.  I recommended cholecystectomy.  Reasonable start out single site and converted to for port if needed given her obesity.  Another option is to get a second opinion with gastroenterology and see if an EGD is needed.  However, the lack of improvement on PPIs times several times argues against this.  She her husband are more inclined to proceed with surgery and feel more convinced the gallbladder is the source of the problem.  I discussed it with them:  The anatomy & physiology of hepatobiliary & pancreatic function was discussed.  The pathophysiology of gallbladder dysfunction was discussed.  Natural history risks without surgery was discussed.   I feel the risks of no intervention will lead to serious problems that outweigh the operative risks; therefore, I recommended cholecystectomy to remove the pathology.  I explained laparoscopic techniques with possible need for an open approach.  Probable cholangiogram to evaluate the bilary tract was explained as well.    Risks such as bleeding, infection, abscess, leak, injury to other organs, need for further treatment, heart attack, death, and other risks were discussed.  I noted a good likelihood this will help address the problem.  Possibility that this will not correct all abdominal symptoms was explained.  Goals of post-operative recovery were discussed as well.  We will work to minimize complications.  An educational handout further explaining the pathology and treatment options was given as well.  Questions were answered.  The  patient expresses understanding & wishes to proceed with surgery.  Hypertension.  Worse.  Restart blood pressure medication (hydrochlorothiazide).  Followup with primary care physician.         

## 2012-09-28 ENCOUNTER — Encounter (HOSPITAL_COMMUNITY): Payer: Self-pay

## 2012-09-28 ENCOUNTER — Encounter (HOSPITAL_COMMUNITY)
Admission: RE | Admit: 2012-09-28 | Discharge: 2012-09-28 | Disposition: A | Discharge status: Home / Self Care | Payer: BC Managed Care – PPO | Source: Ambulatory Visit | Attending: Surgery | Admitting: Surgery

## 2012-09-28 ENCOUNTER — Ambulatory Visit (HOSPITAL_COMMUNITY)
Admission: RE | Admit: 2012-09-28 | Discharge: 2012-09-28 | Disposition: A | Discharge status: Home / Self Care | Payer: BC Managed Care – PPO | Source: Ambulatory Visit | Attending: Surgery | Admitting: Surgery

## 2012-09-28 DIAGNOSIS — J45909 Unspecified asthma, uncomplicated: Secondary | ICD-10-CM | POA: Insufficient documentation

## 2012-09-28 DIAGNOSIS — Z01810 Encounter for preprocedural cardiovascular examination: Secondary | ICD-10-CM | POA: Insufficient documentation

## 2012-09-28 DIAGNOSIS — I10 Essential (primary) hypertension: Secondary | ICD-10-CM | POA: Insufficient documentation

## 2012-09-28 DIAGNOSIS — Z01818 Encounter for other preprocedural examination: Secondary | ICD-10-CM | POA: Insufficient documentation

## 2012-09-28 DIAGNOSIS — R9431 Abnormal electrocardiogram [ECG] [EKG]: Secondary | ICD-10-CM | POA: Insufficient documentation

## 2012-09-28 DIAGNOSIS — Z01812 Encounter for preprocedural laboratory examination: Secondary | ICD-10-CM | POA: Insufficient documentation

## 2012-09-28 HISTORY — DX: Malignant (primary) neoplasm, unspecified: C80.1

## 2012-09-28 HISTORY — DX: Shortness of breath: R06.02

## 2012-09-28 HISTORY — DX: Unspecified asthma, uncomplicated: J45.909

## 2012-09-28 LAB — BASIC METABOLIC PANEL
BUN: 8 mg/dL (ref 6–23)
CO2: 30 mEq/L (ref 19–32)
Calcium: 10.2 mg/dL (ref 8.4–10.5)
Chloride: 104 mEq/L (ref 96–112)
Creatinine, Ser: 0.62 mg/dL (ref 0.50–1.10)

## 2012-09-28 LAB — CBC
HCT: 28.8 % — ABNORMAL LOW (ref 36.0–46.0)
MCH: 21.1 pg — ABNORMAL LOW (ref 26.0–34.0)
MCHC: 30.2 g/dL (ref 30.0–36.0)
RDW: 17.3 % — ABNORMAL HIGH (ref 11.5–15.5)

## 2012-09-28 LAB — SURGICAL PCR SCREEN
MRSA, PCR: NEGATIVE
Staphylococcus aureus: NEGATIVE

## 2012-09-28 LAB — HCG, SERUM, QUALITATIVE: Preg, Serum: NEGATIVE

## 2012-09-28 NOTE — Patient Instructions (Addendum)
18 Dawn Mitchell  09/28/2012   Your procedure is scheduled on: 10/01/12  Report to [facility] at 11:15 AM.  Call this number if you have problems the morning of surgery [phone]: [phone]   Remember:   Do not eat food After Midnight, clear liquids from midnight until 0745 am on 10/01/12 then nothing.      Take these medicines the morning of surgery with A SIP OF WATER: protonix   Do not wear jewelry, make-up or nail polish.  Do not wear lotions, powders, or perfumes. You may wear deodorant.  Do not shave 48 hours prior to surgery. Men may shave face and neck.  Do not bring valuables to the hospital.  Contacts, dentures or bridgework may not be worn into surgery.     Patients discharged the day of surgery will not be allowed to drive home.  Name and phone number of your driver: Annabelle Harman (husband) [phone]     Please read over the following fact sheets that you were given: MRSA Information, clear liquids fact sheet Birdie Sons, RN  pre op nurse call if needed [phone]    FAILURE TO FOLLOW THESE INSTRUCTIONS MAY RESULT IN CANCELLATION OF YOUR SURGERY   Patient Signature: ___________________________________________

## 2012-10-01 ENCOUNTER — Encounter (HOSPITAL_COMMUNITY): Payer: Self-pay | Admitting: Anesthesiology

## 2012-10-01 ENCOUNTER — Ambulatory Visit (HOSPITAL_COMMUNITY): Payer: BC Managed Care – PPO | Admitting: Anesthesiology

## 2012-10-01 ENCOUNTER — Ambulatory Visit (HOSPITAL_COMMUNITY): Payer: BC Managed Care – PPO

## 2012-10-01 ENCOUNTER — Encounter (HOSPITAL_COMMUNITY)
Admission: RE | Disposition: A | Discharge status: Home / Self Care | Payer: Self-pay | Source: Ambulatory Visit | Attending: Surgery

## 2012-10-01 ENCOUNTER — Ambulatory Visit (HOSPITAL_COMMUNITY)
Admission: RE | Admit: 2012-10-01 | Discharge: 2012-10-01 | Disposition: A | Discharge status: Home / Self Care | Payer: BC Managed Care – PPO | Source: Ambulatory Visit | Attending: Surgery | Admitting: Surgery

## 2012-10-01 ENCOUNTER — Encounter (HOSPITAL_COMMUNITY): Payer: Self-pay | Admitting: *Deleted

## 2012-10-01 DIAGNOSIS — K7689 Other specified diseases of liver: Secondary | ICD-10-CM | POA: Insufficient documentation

## 2012-10-01 DIAGNOSIS — K8020 Calculus of gallbladder without cholecystitis without obstruction: Secondary | ICD-10-CM | POA: Insufficient documentation

## 2012-10-01 DIAGNOSIS — I10 Essential (primary) hypertension: Secondary | ICD-10-CM | POA: Insufficient documentation

## 2012-10-01 DIAGNOSIS — K824 Cholesterolosis of gallbladder: Secondary | ICD-10-CM

## 2012-10-01 DIAGNOSIS — K8010 Calculus of gallbladder with chronic cholecystitis without obstruction: Secondary | ICD-10-CM

## 2012-10-01 DIAGNOSIS — Z79899 Other long term (current) drug therapy: Secondary | ICD-10-CM | POA: Insufficient documentation

## 2012-10-01 DIAGNOSIS — Z6841 Body Mass Index (BMI) 40.0 and over, adult: Secondary | ICD-10-CM | POA: Insufficient documentation

## 2012-10-01 HISTORY — PX: LAPAROSCOPIC CHOLECYSTECTOMY SINGLE PORT: SHX5891

## 2012-10-01 HISTORY — PX: INTRAOPERATIVE CHOLANGIOGRAM: SHX5230

## 2012-10-01 SURGERY — LAPAROSCOPIC CHOLECYSTECTOMY SINGLE SITE
Anesthesia: General | Site: Abdomen | Wound class: Clean Contaminated

## 2012-10-01 MED ORDER — ACETAMINOPHEN 10 MG/ML IV SOLN
INTRAVENOUS | Status: AC
  Filled 2012-10-01: qty 100

## 2012-10-01 MED ORDER — BUPIVACAINE-EPINEPHRINE PF 0.25-1:200000 % IJ SOLN
INTRAMUSCULAR | Status: DC | PRN
  Administered 2012-10-01: 50 mL

## 2012-10-01 MED ORDER — 0.9 % SODIUM CHLORIDE (POUR BTL) OPTIME
TOPICAL | Status: DC | PRN
  Administered 2012-10-01: 1000 mL

## 2012-10-01 MED ORDER — MIDAZOLAM HCL 5 MG/5ML IJ SOLN
INTRAMUSCULAR | Status: DC | PRN
  Administered 2012-10-01: 2 mg via INTRAVENOUS

## 2012-10-01 MED ORDER — LABETALOL HCL 5 MG/ML IV SOLN
INTRAVENOUS | Status: AC
  Administered 2012-10-01: 5 mg
  Filled 2012-10-01: qty 4

## 2012-10-01 MED ORDER — SUCCINYLCHOLINE CHLORIDE 20 MG/ML IJ SOLN
INTRAMUSCULAR | Status: DC | PRN
  Administered 2012-10-01: 100 mg via INTRAVENOUS

## 2012-10-01 MED ORDER — FENTANYL CITRATE 0.05 MG/ML IJ SOLN: 25.0000 ug | INTRAMUSCULAR | Status: DC | PRN

## 2012-10-01 MED ORDER — OXYCODONE HCL 5 MG PO TABS: 5.0000 mg | ORAL_TABLET | ORAL | Status: DC | PRN

## 2012-10-01 MED ORDER — IOHEXOL 300 MG/ML  SOLN
INTRAMUSCULAR | Status: AC
  Filled 2012-10-01: qty 1

## 2012-10-01 MED ORDER — SODIUM CHLORIDE 0.9 % IJ SOLN: 3.0000 mL | INTRAMUSCULAR | Status: DC | PRN

## 2012-10-01 MED ORDER — ESMOLOL HCL 10 MG/ML IV SOLN
INTRAVENOUS | Status: DC | PRN
  Administered 2012-10-01 (×2): 20 mg via INTRAVENOUS

## 2012-10-01 MED ORDER — FENTANYL CITRATE 0.05 MG/ML IJ SOLN
INTRAMUSCULAR | Status: DC | PRN
  Administered 2012-10-01 (×5): 50 ug via INTRAVENOUS

## 2012-10-01 MED ORDER — MULTI-VITAMIN/MINERALS PO TABS: 1.0000 | ORAL_TABLET | Freq: Every day | ORAL | Status: DC

## 2012-10-01 MED ORDER — LABETALOL HCL 5 MG/ML IV SOLN
INTRAVENOUS | Status: DC | PRN
  Administered 2012-10-01 (×2): 5 mg via INTRAVENOUS

## 2012-10-01 MED ORDER — BUPIVACAINE-EPINEPHRINE 0.25% -1:200000 IJ SOLN
INTRAMUSCULAR | Status: AC
  Filled 2012-10-01: qty 1

## 2012-10-01 MED ORDER — NAPROXEN 500 MG PO TABS: 500.0000 mg | ORAL_TABLET | Freq: Two times a day (BID) | ORAL | Status: DC

## 2012-10-01 MED ORDER — ONDANSETRON HCL 4 MG/2ML IJ SOLN
INTRAMUSCULAR | Status: DC | PRN
  Administered 2012-10-01: 4 mg via INTRAVENOUS

## 2012-10-01 MED ORDER — PROMETHAZINE HCL 25 MG/ML IJ SOLN: 6.2500 mg | INTRAMUSCULAR | Status: DC | PRN

## 2012-10-01 MED ORDER — PROPOFOL 10 MG/ML IV BOLUS
INTRAVENOUS | Status: DC | PRN
  Administered 2012-10-01: 150 mg via INTRAVENOUS

## 2012-10-01 MED ORDER — LIDOCAINE HCL (CARDIAC) 20 MG/ML IV SOLN
INTRAVENOUS | Status: DC | PRN
  Administered 2012-10-01: 100 mg via INTRAVENOUS

## 2012-10-01 MED ORDER — KETOROLAC TROMETHAMINE 30 MG/ML IJ SOLN
INTRAMUSCULAR | Status: DC | PRN
  Administered 2012-10-01: 30 mg via INTRAVENOUS

## 2012-10-01 MED ORDER — ROCURONIUM BROMIDE 100 MG/10ML IV SOLN
INTRAVENOUS | Status: DC | PRN
  Administered 2012-10-01: 10 mg via INTRAVENOUS
  Administered 2012-10-01: 5 mg via INTRAVENOUS
  Administered 2012-10-01: 35 mg via INTRAVENOUS

## 2012-10-01 MED ORDER — ONDANSETRON HCL 4 MG/2ML IJ SOLN: 4.0000 mg | Freq: Four times a day (QID) | INTRAMUSCULAR | Status: DC | PRN

## 2012-10-01 MED ORDER — IOHEXOL 300 MG/ML  SOLN
INTRAMUSCULAR | Status: DC | PRN
  Administered 2012-10-01: 50 mL via INTRAVENOUS

## 2012-10-01 MED ORDER — GLYCOPYRROLATE 0.2 MG/ML IJ SOLN
INTRAMUSCULAR | Status: DC | PRN
  Administered 2012-10-01: 0.6 mg via INTRAVENOUS

## 2012-10-01 MED ORDER — ACETAMINOPHEN 10 MG/ML IV SOLN
INTRAVENOUS | Status: DC | PRN
  Administered 2012-10-01: 1000 mg via INTRAVENOUS

## 2012-10-01 MED ORDER — DEXAMETHASONE SODIUM PHOSPHATE 10 MG/ML IJ SOLN
INTRAMUSCULAR | Status: DC | PRN
  Administered 2012-10-01: 10 mg via INTRAVENOUS

## 2012-10-01 MED ORDER — HYDROMORPHONE HCL PF 1 MG/ML IJ SOLN
INTRAMUSCULAR | Status: AC
  Filled 2012-10-01: qty 1

## 2012-10-01 MED ORDER — LACTATED RINGERS IV SOLN
INTRAVENOUS | Status: DC | PRN
  Administered 2012-10-01 (×2): via INTRAVENOUS

## 2012-10-01 MED ORDER — ACETAMINOPHEN 650 MG RE SUPP: 650.0000 mg | RECTAL | Status: DC | PRN

## 2012-10-01 MED ORDER — HYDRALAZINE HCL 20 MG/ML IJ SOLN
20.0000 mg | Freq: Once | INTRAMUSCULAR | Status: AC
  Administered 2012-10-01 (×2): 5 mg via INTRAVENOUS

## 2012-10-01 MED ORDER — HYDRALAZINE HCL 20 MG/ML IJ SOLN
INTRAMUSCULAR | Status: AC
  Administered 2012-10-01: 5 mg
  Filled 2012-10-01: qty 1

## 2012-10-01 MED ORDER — HYDROMORPHONE HCL PF 1 MG/ML IJ SOLN
0.2500 mg | INTRAMUSCULAR | Status: DC | PRN
  Administered 2012-10-01 (×4): 0.5 mg via INTRAVENOUS

## 2012-10-01 MED ORDER — ACETAMINOPHEN 325 MG PO TABS: 650.0000 mg | ORAL_TABLET | ORAL | Status: DC | PRN

## 2012-10-01 MED ORDER — LABETALOL HCL 5 MG/ML IV SOLN: 5.0000 mg | INTRAVENOUS | Status: DC | PRN

## 2012-10-01 MED ORDER — SODIUM CHLORIDE 0.9 % IJ SOLN: 3.0000 mL | Freq: Two times a day (BID) | INTRAMUSCULAR | Status: DC

## 2012-10-01 MED ORDER — SODIUM CHLORIDE 0.9 % IV SOLN: 250.0000 mL | INTRAVENOUS | Status: DC | PRN

## 2012-10-01 MED ORDER — NEOSTIGMINE METHYLSULFATE 1 MG/ML IJ SOLN
INTRAMUSCULAR | Status: DC | PRN
  Administered 2012-10-01: 4 mg via INTRAVENOUS

## 2012-10-01 MED ORDER — PANTOPRAZOLE SODIUM 40 MG PO TBEC
40.0000 mg | DELAYED_RELEASE_TABLET | Freq: Every day | ORAL | Status: DC
  Administered 2012-10-01: 40 mg via ORAL
  Filled 2012-10-01 (×2): qty 1

## 2012-10-01 SURGICAL SUPPLY — 46 items
APPLIER CLIP ROT 10 11.4 M/L (STAPLE)
APPLIER CLIP UNV 5X34 EPIX (ENDOMECHANICALS) ×2 IMPLANT
APR CLP MED LRG 11.4X10 (STAPLE)
APR XCLPCLP 20M/L UNV 34X5 (ENDOMECHANICALS) ×1
BAG SPEC RTRVL LRG 6X4 10 (ENDOMECHANICALS) ×1
CABLE HIGH FREQUENCY MONO STRZ (ELECTRODE) ×2 IMPLANT
CANISTER SUCTION 2500CC (MISCELLANEOUS) ×2 IMPLANT
CLIP APPLIE ROT 10 11.4 M/L (STAPLE) IMPLANT
CLOTH BEACON ORANGE TIMEOUT ST (SAFETY) ×2 IMPLANT
COVER MAYO STAND STRL (DRAPES) ×2 IMPLANT
DECANTER SPIKE VIAL GLASS SM (MISCELLANEOUS) ×2 IMPLANT
DRAIN CHANNEL 19F RND (DRAIN) IMPLANT
DRAPE C-ARM 42X72 X-RAY (DRAPES) ×2 IMPLANT
DRAPE LAPAROSCOPIC ABDOMINAL (DRAPES) ×2 IMPLANT
DRAPE WARM FLUID 44X44 (DRAPE) ×2 IMPLANT
DRSG TEGADERM 4X4.75 (GAUZE/BANDAGES/DRESSINGS) ×2 IMPLANT
ELECT REM PT RETURN 9FT ADLT (ELECTROSURGICAL) ×2
ELECTRODE REM PT RTRN 9FT ADLT (ELECTROSURGICAL) ×1 IMPLANT
EVACUATOR SILICONE 100CC (DRAIN) IMPLANT
GAUZE SPONGE 2X2 8PLY STRL LF (GAUZE/BANDAGES/DRESSINGS) ×1 IMPLANT
GLOVE BIOGEL PI IND STRL 7.0 (GLOVE) ×1 IMPLANT
GLOVE BIOGEL PI INDICATOR 7.0 (GLOVE) ×1
GLOVE ECLIPSE 8.0 STRL XLNG CF (GLOVE) ×2 IMPLANT
GLOVE INDICATOR 8.0 STRL GRN (GLOVE) ×2 IMPLANT
GOWN STRL NON-REIN LRG LVL3 (GOWN DISPOSABLE) IMPLANT
GOWN STRL REIN XL XLG (GOWN DISPOSABLE) ×7 IMPLANT
IV LACTATED RINGER IRRG 3000ML (IV SOLUTION) ×2
IV LR IRRIG 3000ML ARTHROMATIC (IV SOLUTION) ×1 IMPLANT
KIT BASIN OR (CUSTOM PROCEDURE TRAY) ×2 IMPLANT
NS IRRIG 1000ML POUR BTL (IV SOLUTION) ×2 IMPLANT
POUCH SPECIMEN RETRIEVAL 10MM (ENDOMECHANICALS) ×1 IMPLANT
SCALPEL HARMONIC ACE (MISCELLANEOUS) ×2 IMPLANT
SCISSORS LAP 5X35 DISP (ENDOMECHANICALS) ×1 IMPLANT
SET CHOLANGIOGRAPH MIX (MISCELLANEOUS) ×2 IMPLANT
SET IRRIG TUBING LAPAROSCOPIC (IRRIGATION / IRRIGATOR) ×2 IMPLANT
SOLUTION ANTI FOG 6CC (MISCELLANEOUS) IMPLANT
SPONGE GAUZE 2X2 STER 10/PKG (GAUZE/BANDAGES/DRESSINGS) ×1
STRIP CLOSURE SKIN 1/2X4 (GAUZE/BANDAGES/DRESSINGS) IMPLANT
SUT MNCRL AB 4-0 PS2 18 (SUTURE) ×2 IMPLANT
SUT VICRYL 0 TIES 12 18 (SUTURE) IMPLANT
TOWEL OR 17X26 10 PK STRL BLUE (TOWEL DISPOSABLE) ×2 IMPLANT
TRAY LAP CHOLE (CUSTOM PROCEDURE TRAY) ×2 IMPLANT
TROCAR 5M 150ML BLDLS (TROCAR) ×2 IMPLANT
TROCAR Z-THREAD FIOS 11X100 BL (TROCAR) IMPLANT
TROCAR Z-THREAD FIOS 5X100MM (TROCAR) ×1 IMPLANT
TUBING INSUFFLATION 10FT LAP (TUBING) ×2 IMPLANT

## 2012-10-01 NOTE — Anesthesia Preprocedure Evaluation (Addendum)
Anesthesia Evaluation  Patient identified by MRN, date of birth, ID band Patient awake    Reviewed: Allergy & Precautions, H&P , NPO status , Patient's Chart, lab work & pertinent test results  Airway Mallampati: II TM Distance: >3 FB Neck ROM: Full    Dental  (+) Dental Advisory Given, Missing and Chipped Missing many teeth. Many broken teeth present. Denies loose teeth.:   Pulmonary shortness of breath, asthma ,  CXR: No acute abnormality. breath sounds clear to auscultation  Pulmonary exam normal       Cardiovascular hypertension, Rhythm:Regular Rate:Normal  ECG: nsr , LVH, nonspecific TWA  ECHO 2005: EF 60%, no abnormality noted.   Neuro/Psych negative neurological ROS  negative psych ROS   GI/Hepatic negative GI ROS, (+) Hepatitis -Nonalcoholic steatohepatitis.   Endo/Other  Morbid obesity  Renal/GU negative Renal ROS  negative genitourinary   Musculoskeletal negative musculoskeletal ROS (+)   Abdominal (+) + obese,   Peds negative pediatric ROS (+)  Hematology Anemia. Hgb 8.7   Anesthesia Other Findings   Reproductive/Obstetrics Negative pregnancy test 09-28-12                        Anesthesia Physical Anesthesia Plan  ASA: III  Anesthesia Plan: General   Post-op Pain Management:    Induction: Intravenous  Airway Management Planned: Oral ETT  Additional Equipment:   Intra-op Plan:   Post-operative Plan: Extubation in OR  Informed Consent: I have reviewed the patients History and Physical, chart, labs and discussed the procedure including the risks, benefits and alternatives for the proposed anesthesia with the patient or authorized representative who has indicated his/her understanding and acceptance.   Dental advisory given  Plan Discussed with: CRNA  Anesthesia Plan Comments:         Anesthesia Quick Evaluation

## 2012-10-01 NOTE — Interval H&P Note (Signed)
History and Physical Interval Note:  10/01/2012 1:10 PM  Dawn Mitchell  has presented today for surgery, with the diagnosis of Symptomatic biliary colic, probable chronic cholecystitis  The various methods of treatment have been discussed with the patient and family. After consideration of risks, benefits and other options for treatment, the patient has consented to  Procedure(s): LAPAROSCOPIC CHOLECYSTECTOMY SINGLE PORT (N/A) INTRAOPERATIVE CHOLANGIOGRAM (N/A) as a surgical intervention .  The patient's history has been reviewed, patient examined, no change in status, stable for surgery.  I have reviewed the patient's chart and labs.  Questions were answered to the patient's satisfaction.     Consuela Widener C.

## 2012-10-01 NOTE — Transfer of Care (Signed)
Immediate Anesthesia Transfer of Care Note  Patient: Dawn Mitchell  Procedure(s) Performed: Procedure(s): LAPAROSCOPIC CHOLECYSTECTOMY SINGLE PORT (N/A) INTRAOPERATIVE CHOLANGIOGRAM (N/A)  Patient Location: PACU  Anesthesia Type:General  Level of Consciousness: awake, sedated and patient cooperative  Airway & Oxygen Therapy: Patient Spontanous Breathing and Patient connected to face mask oxygen  Post-op Assessment: Report given to PACU RN and Post -op Vital signs reviewed and stable  Post vital signs: Reviewed and stable  Complications: No apparent anesthesia complications

## 2012-10-01 NOTE — H&P (View-Only) (Signed)
Subjective:     Patient ID: Dawn Mitchell, female   DOB: 01-27-1971, 42 y.o.   MRN: [id number]  HPI  Dawn Mitchell  January 30, 1971 [id number]  Patient Care Team: Leanor Rubenstein, MD as PCP - General (Family Medicine)  This patient is a 42 y.o.female who presents today for surgical evaluation at the request of Dr Wynelle Link.   Reason for visit: Abdominal pains and gallstones.  Pleasant obese female with intermittent abdominal pains. She comes today with her husband.   Usually happens after eating.  Upper abdomen.  Epigastric usually worst location.  Attacks have been severe enough to make her go to the emergency room.  She will often get nauseated.  Almost of emesis but not quite.  No bloating.  He has been given PPI and acid medicines.  She has taken them 2-3 weeks at a time several times without much help.  Attacks more consistently after meals.  Now radiating to her right back as well.  She can walk a half mile to a quarter mile before stopping with shortness of breath.  Normally has a bowel movement every one to two days.  She recently est. With the primary care physician.  Ultrasound was done concerning for gallstones.  Because of that lack of improvement on latest round of proton pump inhibitor, she was sent to me by Dr. Wynelle Link for evaluation.  No worsening reflux while supine or bending over.  No belching.  No dysphagia to solids or liquids.  No jaundice.  Does not drink alcohol.  No pancreatitis.  Does not smoke.  No family history of bowel issues.  No personal nor family history of GI/colon cancer, inflammatory bowel disease, irritable bowel syndrome, allergy such as Celiac Sprue, dietary/dairy problems, colitis, ulcers nor gastritis.  No recent sick contacts/gastroenteritis.  No travel outside the country.  No changes in diet.    Patient Active Problem List   Diagnosis Date Noted  . Chronic cholecystitis with calculus 09/22/2012  . Steatohepatitis, nonalcoholic 09/22/2012  . Obesity, Class III,  BMI 40-49.9 (morbid obesity) 09/22/2012  . Hypertension     Past Medical History  Diagnosis Date  . Hypertension     History reviewed. No pertinent past surgical history.  History   Social History  . Marital Status: Married    Spouse Name: N/A    Number of Children: N/A  . Years of Education: N/A   Occupational History  . Not on file.   Social History Main Topics  . Smoking status: Never Smoker   . Smokeless tobacco: Not on file  . Alcohol Use: No  . Drug Use: No  . Sexually Active: Not on file   Other Topics Concern  . Not on file   Social History Narrative  . No narrative on file    Family History  Problem Relation Age of Onset  . Diabetes Mother   . Heart attack Mother   . Stroke Mother   . Hypertension Mother   . Hypertension Father   . Hypertension Sister   . Stroke Sister   . Cancer Paternal Grandmother     breast  . Hypertension Sister     Current Outpatient Prescriptions  Medication Sig Dispense Refill  . Multiple Vitamins-Minerals (MULTIVITAMIN WITH MINERALS) tablet Take 1 tablet by mouth daily.      . pantoprazole (PROTONIX) 40 MG tablet Take 1 tablet (40 mg total) by mouth daily.  14 tablet  0  . hydrochlorothiazide (HYDRODIURIL) 25 MG tablet Take 1  tablet (25 mg total) by mouth daily.  30 tablet  3  . magnesium hydroxide (MILK OF MAGNESIA) 800 MG/5ML suspension Take 30 mLs by mouth daily as needed for constipation or heartburn.       No current facility-administered medications for this visit.     No Known Allergies  BP 146/110  Pulse 104  Temp(Src) 97.5 F (36.4 C) (Temporal)  Ht 5' 6.5" (1.689 m)  Wt 252 lb 9.6 oz (114.579 kg)  BMI 40.16 kg/m2  SpO2 98%  LMP 08/28/2012  US Abdomen Complete  09/07/2012  *RADIOLOGY REPORT*  Clinical Data:  42 year old female with abdominal pain.  ABDOMINAL ULTRASOUND COMPLETE  Comparison:  None  Findings:  Gallbladder: At least three mobile gallstones are identified, the largest measuring 2 cm.   There is no evidence of gallbladder wall thickening, pericholecystic fluid or sonographic Murphy's sign to suggest acute cholecystitis.  Common Bile Duct:  There is no evidence of intrahepatic or extrahepatic biliary dilation. The CBD measures 4.7 mm in greatest diameter.  Liver: Mildly increased echogenicity of the liver likely represents mild fatty infiltration.  A cyst along the anterior right liver is present.  No other focal hepatic abnormalities are noted.  IVC:  Appears normal.  Pancreas:  Although the pancreas is difficult to visualize in its entirety, no focal pancreatic abnormality is identified.  Spleen:  Within normal limits in size and echotexture.  Right kidney:  The right kidney is normal in size and parenchymal echogenicity.  There is no evidence of solid mass, hydronephrosis or definite renal calculi.  The right kidney measures  cm.  Left kidney:  The left kidney is normal in size and parenchymal echogenicity.  There is no evidence of solid mass, hydronephrosis or definite renal calculi.   The left kidney measures  cm.  Abdominal Aorta:  No abdominal aortic aneurysm identified.  There is no evidence of ascites.  IMPRESSION: Cholelithiasis without evidence of acute cholecystitis.  Probable mild fatty infiltration of the liver.   Original Report Authenticated By: Harmon Pier, M.D.      Review of Systems  Constitutional: Negative for fever, chills, diaphoresis, activity change, appetite change and fatigue.  HENT: Negative for ear pain, sore throat, trouble swallowing, neck pain and ear discharge.   Eyes: Negative for photophobia, discharge and visual disturbance.  Respiratory: Negative for cough, choking, chest tightness, wheezing and stridor.   Cardiovascular: Negative for chest pain, palpitations and leg swelling.  Gastrointestinal: Positive for nausea and abdominal pain. Negative for vomiting, diarrhea, constipation, blood in stool, abdominal distention, anal bleeding and rectal pain.   Genitourinary: Negative for dysuria, frequency and difficulty urinating.  Musculoskeletal: Negative for myalgias and gait problem.  Skin: Negative for color change, pallor and rash.  Neurological: Negative for dizziness, speech difficulty, weakness and numbness.  Hematological: Negative for adenopathy.  Psychiatric/Behavioral: Negative for confusion and agitation. The patient is not nervous/anxious.        Objective:   Physical Exam  Constitutional: She is oriented to person, place, and time. She appears well-developed and well-nourished. No distress.  HENT:  Head: Normocephalic.  Mouth/Throat: Oropharynx is clear and moist. No oropharyngeal exudate.  Eyes: Conjunctivae and EOM are normal. Pupils are equal, round, and reactive to light. No scleral icterus.  Neck: Normal range of motion. Neck supple. No tracheal deviation present.  Cardiovascular: Normal rate, regular rhythm and intact distal pulses.   Pulmonary/Chest: Effort normal and breath sounds normal. No respiratory distress. She exhibits no tenderness.  Abdominal: Soft. She  exhibits no distension, no pulsatile liver and no mass. There is tenderness in the right upper quadrant and epigastric area. There is no rigidity, no rebound, no guarding, no CVA tenderness, no tenderness at McBurney's point and negative Murphy's sign. No hernia. Hernia confirmed negative in the right inguinal area and confirmed negative in the left inguinal area.  Mild abdominal discomfort.  Genitourinary: No vaginal discharge found.  Musculoskeletal: Normal range of motion. She exhibits no tenderness.  Lymphadenopathy:    She has no cervical adenopathy.       Right: No inguinal adenopathy present.       Left: No inguinal adenopathy present.  Neurological: She is alert and oriented to person, place, and time. No cranial nerve deficit. She exhibits normal muscle tone. Coordination normal.  Skin: Skin is warm and dry. No rash noted. She is not diaphoretic. No  erythema.  Psychiatric: She has a normal mood and affect. Her behavior is normal. Judgment and thought content normal.       Assessment:     Postprandial nausea and abdominal pain suspicious for chronic cholecystitis.  No improvement on PPIs argues against gastroesophageal reflux disease/gastritis     Plan:     I offered her options.  I think at this point, I am more convinced that the gallbladder as etiology of her symptoms.  I recommended cholecystectomy.  Reasonable start out single site and converted to for port if needed given her obesity.  Another option is to get a second opinion with gastroenterology and see if an EGD is needed.  However, the lack of improvement on PPIs times several times argues against this.  She her husband are more inclined to proceed with surgery and feel more convinced the gallbladder is the source of the problem.  I discussed it with them:  The anatomy & physiology of hepatobiliary & pancreatic function was discussed.  The pathophysiology of gallbladder dysfunction was discussed.  Natural history risks without surgery was discussed.   I feel the risks of no intervention will lead to serious problems that outweigh the operative risks; therefore, I recommended cholecystectomy to remove the pathology.  I explained laparoscopic techniques with possible need for an open approach.  Probable cholangiogram to evaluate the bilary tract was explained as well.    Risks such as bleeding, infection, abscess, leak, injury to other organs, need for further treatment, heart attack, death, and other risks were discussed.  I noted a good likelihood this will help address the problem.  Possibility that this will not correct all abdominal symptoms was explained.  Goals of post-operative recovery were discussed as well.  We will work to minimize complications.  An educational handout further explaining the pathology and treatment options was given as well.  Questions were answered.  The  patient expresses understanding & wishes to proceed with surgery.  Hypertension.  Worse.  Restart blood pressure medication (hydrochlorothiazide).  Followup with primary care physician.

## 2012-10-01 NOTE — Op Note (Signed)
10/01/2012  3:21 PM  PATIENT:  Dawn Mitchell  42 y.o. female  Patient Care Team: Leanor Rubenstein, MD as PCP - General (Family Medicine)  PRE-OPERATIVE DIAGNOSIS:  Symptomatic biliary colic, probable chronic cholecystitis  POST-OPERATIVE DIAGNOSIS:  Symptomatic biliary colic, probable chronic cholecystitis  PROCEDURE:  Procedure(s): LAPAROSCOPIC CHOLECYSTECTOMY SINGLE PORT INTRAOPERATIVE CHOLANGIOGRAM  SURGEON:  Surgeon(s): Ardeth Sportsman, MD  ASSISTANT: RN   ANESTHESIA:   local and general  EBL:  Total I/O In: 1000 [I.V.:1000] Out: -   Delay start of Pharmacological VTE agent (>24hrs) due to surgical blood loss or risk of bleeding:  no  DRAINS: none   SPECIMEN:  Source of Specimen:  Gallbladder  DISPOSITION OF SPECIMEN:  PATHOLOGY  COUNTS:  YES  PLAN OF CARE: Discharge to home after PACU  PATIENT DISPOSITION:  PACU - hemodynamically stable.  INDICATION: Female with abdominal pain suspicious for biliary colic.  Now more constant suspicious for cholecystitis.  I recommended cholecystectomy  The anatomy & physiology of hepatobiliary & pancreatic function was discussed.  The pathophysiology of gallbladder dysfunction was discussed.  Natural history risks without surgery was discussed.   I feel the risks of no intervention will lead to serious problems that outweigh the operative risks; therefore, I recommended cholecystectomy to remove the pathology.  I explained laparoscopic techniques with possible need for an open approach.  Probable cholangiogram to evaluate the bilary tract was explained as well.    Risks such as bleeding, infection, abscess, leak, injury to other organs, need for further treatment, heart attack, death, and other risks were discussed.  I noted a good likelihood this will help address the problem.  Possibility that this will not correct all abdominal symptoms was explained.  Goals of post-operative recovery were discussed as well.  We will work to  minimize complications.  An educational handout further explaining the pathology and treatment options was given as well.  Questions were answered.  The patient expresses understanding & wishes to proceed with surgery.   OR FINDINGS: Mild gallbladder wall thickening.  Dilated.  2 cm stone impacted at neck.  Narrow cystic duct.  Cholangiogram within normal limits.  Mild fatty change but no evidence of cirrhosis.  DESCRIPTION:   The patient was identified & brought in the operating room. The patient was positioned supine with arms tucked. SCDs were active during the entire case. The patient underwent general anesthesia without any difficulty.  The abdomen was prepped and draped in a sterile fashion. A Surgical Timeout confirmed our plan.  I made a transverse curvilinear incision through the superior umbilical fold.  I placed a 5mm long port through the supraumbilical fascia using a modified Hassan cutdown technique. I began carbon dioxide insufflation. Camera inspection revealed no injury. There were no adhesions to the anterior abdominal wall supraumbilically.  I proceeded to continue with single site technique. I placed a #5 port in left upper aspect of the wound. I placed a 5 mm atraumatic grasper in the right inferior aspect of the wound.  I turned attention to the right upper quadrant.  he gallbladder fundus was elevated cephalad. I freed the peritoneal coverings between the gallbladder and the liver on the posteriolateral and anteriomedial walls. I alternated between Harmonic & blunt Maryland dissection to help get a good critical view of the cystic artery and cystic duct. I did further dissection to free a few centimeters of the  gallbladder off the liver bed to get a good critical view of the infundibulum and cystic  duct. I mobilized the cystic artery; and, after getting a good 360 view, ligated the cystic artery using the Harmonic ultrasonic dissection. I skeletonized the cystic duct.  I  placed a clip on the infundibulum. I did a partial cystic duct-otomy and ensured patency. I placed a 5 [place] cholangiocatheter through a puncture site at the right subcostal ridge of the abdominal wall and directed it into the cystic duct.  While waiting for fluoroscopy set up, I removed the gallbladder off the liver in a dome down fashion.  We ran a cholangiogram with dilute radio-opaque contrast and continuous fluoroscopy. Contrast flowed from a side branch consistent with cystic duct cannulization. Contrast flowed up the common hepatic duct into the right and left intrahepatic chains out to secondary radicals. Contrast flowed down the common bile duct easily across the normal ampulla into the duodenum.  Upper limits of normal but bile duct diameter is okay.  This was consistent with a normal cholangiogram.  I removed the cholangiocatheter. I placed clips on the cystic duct x4.  I completed cystic duct transection.  I ensured hemostasis on the gallbladder fossa of the liver and elsewhere. I inspected the rest of the abdomen & detected no injury nor bleeding elsewhere.  I removed the gallbladder out the supraumbilical fascia. I closed the fascia transversely using 0 Vicryl interrupted stitches. I closed the skin using 4-0 monocryl stitch.  Sterile dressing was applied. The patient was extubated & arrived in the PACU in stable condition..  I had discussed postoperative care with the patient in the holding area.   I am about to locate the patient's family and discuss operative findings and postoperative goals / instructions.  Instructions are written in the chart as well.

## 2012-10-01 NOTE — Preoperative (Signed)
Beta Blockers   Reason not to administer Beta Blockers:Not Applicable 

## 2012-10-04 ENCOUNTER — Encounter (HOSPITAL_COMMUNITY): Payer: Self-pay | Admitting: Surgery

## 2012-10-04 NOTE — Anesthesia Postprocedure Evaluation (Signed)
  Anesthesia Post-op Note  Patient: Dawn Mitchell  Procedure(s) Performed: Procedure(s) (LRB): LAPAROSCOPIC CHOLECYSTECTOMY SINGLE PORT (N/A) INTRAOPERATIVE CHOLANGIOGRAM (N/A)  Patient Location: PACU  Anesthesia Type: General  Level of Consciousness: awake and alert   Airway and Oxygen Therapy: Patient Spontanous Breathing  Post-op Pain: mild  Post-op Assessment: Post-op Vital signs reviewed, Patient's Cardiovascular Status Stable, Respiratory Function Stable, Patent Airway and No signs of Nausea or vomiting  Last Vitals:  Filed Vitals:   10/01/12 1757  BP: 151/82  Pulse: 93  Temp: 36.7 C  Resp: 16    Post-op Vital Signs: stable   Complications: No apparent anesthesia complications

## 2012-10-05 ENCOUNTER — Telehealth (INDEPENDENT_AMBULATORY_CARE_PROVIDER_SITE_OTHER): Payer: Self-pay | Admitting: General Surgery

## 2012-10-05 ENCOUNTER — Other Ambulatory Visit (INDEPENDENT_AMBULATORY_CARE_PROVIDER_SITE_OTHER): Payer: Self-pay | Admitting: Surgery

## 2012-10-05 MED ORDER — PROMETHAZINE HCL 12.5 MG PO TABS: 12.5000 mg | ORAL_TABLET | Freq: Four times a day (QID) | ORAL | Status: DC | PRN

## 2012-10-05 MED ORDER — PROMETHAZINE HCL 25 MG RE SUPP: 25.0000 mg | Freq: Four times a day (QID) | RECTAL | Status: DC | PRN

## 2012-10-05 NOTE — Telephone Encounter (Signed)
Phenergan e-Rx'd to pharmacy

## 2012-10-05 NOTE — Telephone Encounter (Signed)
Offer phenergan PO/PR Agree with bland diet - consider liquids only x2 days Tx constipation if that is an issue

## 2012-10-05 NOTE — Telephone Encounter (Signed)
Patient's husband called and wanted to report that Dawn Mitchell is having waves of nausea from her gallbladder on 10-01-12. I told him to tell her to do a bane diet and drink some ginger ale. I asked if she was running any fever and is her bowel are moving ok and he stated yes. I also told him have her get to move around the house more and when she sit down that she can do heat and cold to the surgery sites and I stated that if they need to call us back that someone is here all the time.

## 2012-10-06 ENCOUNTER — Telehealth (INDEPENDENT_AMBULATORY_CARE_PROVIDER_SITE_OTHER): Payer: Self-pay

## 2012-10-06 NOTE — Telephone Encounter (Signed)
Called pt to check on her after her message yesterday c/o nausea after lap chole done last week by Dr Michaell Cowing. The pt states that she is trying the bland diet like instructed and her bowels are moving since last night. I advised pt that Dr Michaell Cowing did prescribe Phenergan tabs and suppositories for her to take depending on how bad the nausea is with the pt. I advised pt that she should not take both at the same time to just pick one or the other of the Phenergan. I did advise pt that the Phenergan would make her sleepy. The pt understands.

## 2012-10-22 ENCOUNTER — Encounter (INDEPENDENT_AMBULATORY_CARE_PROVIDER_SITE_OTHER): Payer: BC Managed Care – PPO | Admitting: Surgery

## 2012-10-22 ENCOUNTER — Encounter (INDEPENDENT_AMBULATORY_CARE_PROVIDER_SITE_OTHER): Payer: Self-pay | Admitting: Surgery

## 2012-10-22 ENCOUNTER — Ambulatory Visit (INDEPENDENT_AMBULATORY_CARE_PROVIDER_SITE_OTHER): Payer: BC Managed Care – PPO | Admitting: Surgery

## 2012-10-22 VITALS — BP 138/90 | HR 80 | Temp 97.3°F | Resp 16 | Ht 66.5 in | Wt 246.0 lb

## 2012-10-22 DIAGNOSIS — K8010 Calculus of gallbladder with chronic cholecystitis without obstruction: Secondary | ICD-10-CM

## 2012-10-22 NOTE — Patient Instructions (Addendum)
GETTING TO GOOD BOWEL HEALTH. Irregular bowel habits such as constipation and diarrhea can lead to many problems over time.  Having one soft bowel movement a day is the most important way to prevent further problems.  The anorectal canal is designed to handle stretching and feces to safely manage our ability to get rid of solid waste (feces, poop, stool) out of our body.  BUT, hard constipated stools can act like ripping concrete bricks and diarrhea can be a burning fire to this very sensitive area of our body, causing inflamed hemorrhoids, anal fissures, increasing risk is perirectal abscesses, abdominal pain/bloating, an making irritable bowel worse.     The goal: ONE SOFT BOWEL MOVEMENT A DAY!  To have soft, regular bowel movements:    Drink at least 8 tall glasses of water a day.     Take plenty of fiber.  Fiber is the undigested part of plant food that passes into the colon, acting s "natures broom" to encourage bowel motility and movement.  Fiber can absorb and hold large amounts of water. This results in a larger, bulkier stool, which is soft and easier to pass. Work gradually over several weeks up to 6 servings a day of fiber (25g a day even more if needed) in the form of: o Vegetables -- Root (potatoes, carrots, turnips), leafy green (lettuce, salad greens, celery, spinach), or cooked high residue (cabbage, broccoli, etc) o Fruit -- Fresh (unpeeled skin & pulp), Dried (prunes, apricots, cherries, etc ),  or stewed ( applesauce)  o Whole grain breads, pasta, etc (whole wheat)  o Bran cereals    Bulking Agents -- This type of water-retaining fiber generally is easily obtained each day by one of the following:  o Psyllium bran -- The psyllium plant is remarkable because its ground seeds can retain so much water. This product is available as Metamucil, Konsyl, Effersyllium, Per Diem Fiber, or the less expensive generic preparation in drug and health food stores. Although labeled a laxative, it really  is not a laxative.  o Methylcellulose -- This is another fiber derived from wood which also retains water. It is available as Citrucel. o Polyethylene Glycol - and "artificial" fiber commonly called Miralax or Glycolax.  It is helpful for people with gassy or bloated feelings with regular fiber o Flax Seed - a less gassy fiber than psyllium   No reading or other relaxing activity while on the toilet. If bowel movements take longer than 5 minutes, you are too constipated   AVOID CONSTIPATION.  High fiber and water intake usually takes care of this.  Sometimes a laxative is needed to stimulate more frequent bowel movements, but    Laxatives are not a good long-term solution as it can wear the colon out. o Osmotics (Milk of Magnesia, Fleets phosphosoda, Magnesium citrate, MiraLax, GoLytely) are safer than  o Stimulants (Senokot, Castor Oil, Dulcolax, Ex Lax)    o Do not take laxatives for more than 7days in a row.    IF SEVERELY CONSTIPATED, try a Bowel Retraining Program: o Do not use laxatives.  o Eat a diet high in roughage, such as bran cereals and leafy vegetables.  o Drink six (6) ounces of prune or apricot juice each morning.  o Eat two (2) large servings of stewed fruit each day.  o Take one (1) heaping tablespoon of a psyllium-based bulking agent twice a day. Use sugar-free sweetener when possible to avoid excessive calories.  o Eat a normal breakfast.  o   Set aside 15 minutes after breakfast to sit on the toilet, but do not strain to have a bowel movement.  o If you do not have a bowel movement by the third day, use an enema and repeat the above steps.    Controlling diarrhea o Switch to liquids and simpler foods for a few days to avoid stressing your intestines further. o Avoid dairy products (especially milk & ice cream) for a short time.  The intestines often can lose the ability to digest lactose when stressed. o Avoid foods that cause gassiness or bloating.  Typical foods include  beans and other legumes, cabbage, broccoli, and dairy foods.  Every person has some sensitivity to other foods, so listen to our body and avoid those foods that trigger problems for you. o Adding fiber (Citrucel, Metamucil, psyllium, Miralax) gradually can help thicken stools by absorbing excess fluid and retrain the intestines to act more normally.  Slowly increase the dose over a few weeks.  Too much fiber too soon can backfire and cause cramping & bloating. o Probiotics (such as active yogurt, Align, etc) may help repopulate the intestines and colon with normal bacteria and calm down a sensitive digestive tract.  Most studies show it to be of mild help, though, and such products can be costly. o Medicines:   Bismuth subsalicylate (ex. Kayopectate, Pepto Bismol) every 30 minutes for up to 6 doses can help control diarrhea.  Avoid if pregnant.   Loperamide (Immodium) can slow down diarrhea.  Start with two tablets (4mg  total) first and then try one tablet every 6 hours.  Avoid if you are having fevers or severe pain.  If you are not better or start feeling worse, stop all medicines and call your doctor for advice o Call your doctor if you are getting worse or not better.  Sometimes further testing (cultures, endoscopy, X-ray studies, bloodwork, etc) may be needed to help diagnose and treat the cause of the diarrhea.  Exercise to Stay Healthy Exercise helps you become and stay healthy. EXERCISE IDEAS AND TIPS Choose exercises that:  You enjoy.  Fit into your day. You do not need to exercise really hard to be healthy. You can do exercises at a slow or medium level and stay healthy. You can:  Stretch before and after working out.  Try yoga, Pilates, or tai chi.  Lift weights.  Walk fast, swim, jog, run, climb stairs, bicycle, dance, or rollerskate.  Take aerobic classes. Exercises that burn about 150 calories:  Running 1  miles in 15 minutes.  Playing volleyball for 45 to 60  minutes.  Washing and waxing a car for 45 to 60 minutes.  Playing touch football for 45 minutes.  Walking 1  miles in 35 minutes.  Pushing a stroller 1  miles in 30 minutes.  Playing basketball for 30 minutes.  Raking leaves for 30 minutes.  Bicycling 5 miles in 30 minutes.  Walking 2 miles in 30 minutes.  Dancing for 30 minutes.  Shoveling snow for 15 minutes.  Swimming laps for 20 minutes.  Walking up stairs for 15 minutes.  Bicycling 4 miles in 15 minutes.  Gardening for 30 to 45 minutes.  Jumping rope for 15 minutes.  Washing windows or floors for 45 to 60 minutes. Document Released: 06/07/2010 Document Revised: 07/28/2011 Document Reviewed: 06/07/2010 [facility] Patient Information 2014 [place], [place].

## 2012-10-22 NOTE — Progress Notes (Signed)
Subjective:     Patient ID: Dawn Mitchell, female   DOB: 12/01/1970, 42 y.o.   MRN: [id number]  HPI  Dawn Mitchell  1970-08-02 [id number]  Patient Care Team: Leanor Rubenstein, MD as PCP - General (Family Medicine)  This patient is a 42 y.o.female who presents today for surgical evaluation s/p surgery 10/01/2012:  POST-OPERATIVE DIAGNOSIS: Symptomatic biliary colic, probable chronic cholecystitis   PROCEDURE: Procedure(s):  LAPAROSCOPIC CHOLECYSTECTOMY SINGLE PORT  INTRAOPERATIVE CHOLANGIOGRAM   SURGEON: Surgeon(s):  Ardeth Sportsman, MD  Diagnosis Gallbladder - CHRONIC CHOLECYSTITIS, CHOLELITHIASIS, AND CHOLESTEROLOSIS. - ONE BENIGN LYMPH NODE. Abigail Miyamoto MD Pathologist, Electronic Signature (Case signed 10/04/2012)   The patient comes in today feeling well.  Eating well.  No nausea or vomiting.  Daily bowel movements.  No diarrhea.  No nausea.  No more soreness.  In good spirits.  Regular activity.  Patient Active Problem List   Diagnosis Date Noted  . Chronic cholecystitis with calculus 09/22/2012  . Steatohepatitis, nonalcoholic 09/22/2012  . Obesity, Class III, BMI 40-49.9 (morbid obesity) 09/22/2012  . Hypertension     Past Medical History  Diagnosis Date  . Hypertension   . Asthma   . Shortness of breath   . Cancer 2004    "abnormal cervical cells"    Past Surgical History  Procedure Laterality Date  . Tubal ligation    . Cesarean section  1993  . Laparoscopic cholecystectomy single port N/A 10/01/2012    Procedure: LAPAROSCOPIC CHOLECYSTECTOMY SINGLE PORT;  Surgeon: Ardeth Sportsman, MD;  Location: [facility];  Service: General;  Laterality: N/A;  . Intraoperative cholangiogram N/A 10/01/2012    Procedure: INTRAOPERATIVE CHOLANGIOGRAM;  Surgeon: Ardeth Sportsman, MD;  Location: [facility];  Service: General;  Laterality: N/A;  . Cholecystectomy      History   Social History  . Marital Status: Married    Spouse Name: N/A    Number of Children: N/A  .  Years of Education: N/A   Occupational History  . Not on file.   Social History Main Topics  . Smoking status: Never Smoker   . Smokeless tobacco: Never Used  . Alcohol Use: No  . Drug Use: No  . Sexually Active: Not on file   Other Topics Concern  . Not on file   Social History Narrative  . No narrative on file    Family History  Problem Relation Age of Onset  . Diabetes Mother   . Heart attack Mother   . Stroke Mother   . Hypertension Mother   . Hypertension Father   . Hypertension Sister   . Stroke Sister   . Cancer Paternal Grandmother     breast  . Hypertension Sister     Current Outpatient Prescriptions  Medication Sig Dispense Refill  . hydrochlorothiazide (HYDRODIURIL) 25 MG tablet Take 25 mg by mouth daily.      . Multiple Vitamins-Minerals (MULTIVITAMIN WITH MINERALS) tablet Take 1 tablet by mouth daily.      . pantoprazole (PROTONIX) 40 MG tablet Take 1 tablet (40 mg total) by mouth daily.  14 tablet  0   No current facility-administered medications for this visit.     No Known Allergies  BP 138/90  Pulse 80  Temp(Src) 97.3 F (36.3 C) (Temporal)  Resp 16  Ht 5' 6.5" (1.689 m)  Wt 246 lb (111.585 kg)  BMI 39.12 kg/m2  SpO2 99%  LMP 09/23/2012  Dg Chest 2 View  09/28/2012   *RADIOLOGY  REPORT*  Clinical Data: Hypertension, asthma, preoperative evaluation for cholecystectomy  CHEST - 2 VIEW  Comparison: 04/29/2009  Findings: Cardiac shadow is stable.  The lungs are clear.  No acute bony abnormality is seen.  IMPRESSION: No acute abnormality noted.   Original Report Authenticated By: Alcide Clever, M.D.   Dg Cholangiogram Operative  10/01/2012   *RADIOLOGY REPORT*  Clinical Data: Right upper quadrant pain.  Cholecystectomy.  INTRAOPERATIVE CHOLANGIOGRAM  Technique:  Multiple fluoroscopic spot radiographs were obtained during intraoperative cholangiogram and are submitted for interpretation post-operatively.  Comparison: None.  Findings: Visualized  biliary and hepatic ducts appear normal without stricture or filling defect.  Contrast flows into the duodenum.  IMPRESSION: Normal cholangiogram.   Original Report Authenticated By: Holley Dexter, M.D.     Review of Systems  Constitutional: Negative for fever, chills and diaphoresis.  HENT: Negative for ear pain, sore throat and trouble swallowing.   Eyes: Negative for photophobia and visual disturbance.  Respiratory: Negative for cough and choking.   Cardiovascular: Negative for chest pain and palpitations.  Gastrointestinal: Negative for nausea, vomiting, abdominal pain, diarrhea, constipation, anal bleeding and rectal pain.  Genitourinary: Negative for dysuria, frequency and difficulty urinating.  Musculoskeletal: Negative for myalgias and gait problem.  Skin: Negative for color change, pallor and rash.  Neurological: Negative for dizziness, speech difficulty, weakness and numbness.  Hematological: Negative for adenopathy.  Psychiatric/Behavioral: Negative for confusion and agitation. The patient is not nervous/anxious.        Objective:   Physical Exam  Constitutional: She is oriented to person, place, and time. She appears well-developed and well-nourished. No distress.  HENT:  Head: Normocephalic.  Mouth/Throat: Oropharynx is clear and moist. No oropharyngeal exudate.  Eyes: Conjunctivae and EOM are normal. Pupils are equal, round, and reactive to light. No scleral icterus.  Neck: Normal range of motion. No tracheal deviation present.  Cardiovascular: Normal rate and intact distal pulses.   Pulmonary/Chest: Effort normal. No respiratory distress. She exhibits no tenderness.  Abdominal: Soft. She exhibits no distension. There is no tenderness. Hernia confirmed negative in the right inguinal area and confirmed negative in the left inguinal area.  Incisions clean with normal healing ridges.  No hernias  Genitourinary: No vaginal discharge found.  Musculoskeletal: Normal range  of motion. She exhibits no tenderness.  Lymphadenopathy:       Right: No inguinal adenopathy present.       Left: No inguinal adenopathy present.  Neurological: She is alert and oriented to person, place, and time. No cranial nerve deficit. She exhibits normal muscle tone. Coordination normal.  Skin: Skin is warm and dry. No rash noted. She is not diaphoretic.  Psychiatric: She has a normal mood and affect. Her behavior is normal.       Assessment:     Recovering well only a few weeks status post cholecystectomy for chronic cholecystitis     Plan:    Increase activity as tolerated to regular activity.  Low impact exercise such as walking an hour a day at least ideal.  Do not push through pain.  Diet as tolerated.  Low fat high fiber diet ideal.  Bowel regimen with 30 g fiber a day and fiber supplement as needed to avoid problems.  Return to clinic as needed.   Instructions discussed.  Followup with primary care physician for other health issues as would normally be done.  Questions answered.  The patient expressed understanding and appreciation

## 2013-08-04 ENCOUNTER — Other Ambulatory Visit (HOSPITAL_COMMUNITY): Payer: Self-pay | Admitting: Family Medicine

## 2013-08-04 DIAGNOSIS — Z1231 Encounter for screening mammogram for malignant neoplasm of breast: Secondary | ICD-10-CM

## 2013-08-10 ENCOUNTER — Ambulatory Visit (HOSPITAL_COMMUNITY)
Admission: RE | Admit: 2013-08-10 | Discharge: 2013-08-10 | Disposition: A | Discharge status: Home / Self Care | Payer: BC Managed Care – PPO | Source: Ambulatory Visit | Attending: Family Medicine | Admitting: Family Medicine

## 2013-08-10 DIAGNOSIS — Z1231 Encounter for screening mammogram for malignant neoplasm of breast: Secondary | ICD-10-CM | POA: Insufficient documentation

## 2014-04-07 IMAGING — CR DG CHEST 2V
2 series · 2 of 2 positions shown · non-contrast
Comparison: 04/29/2009

CLINICAL DATA: Hypertension, asthma, preoperative evaluation for
cholecystectomy

CHEST - 2 VIEW

[w chest pa]
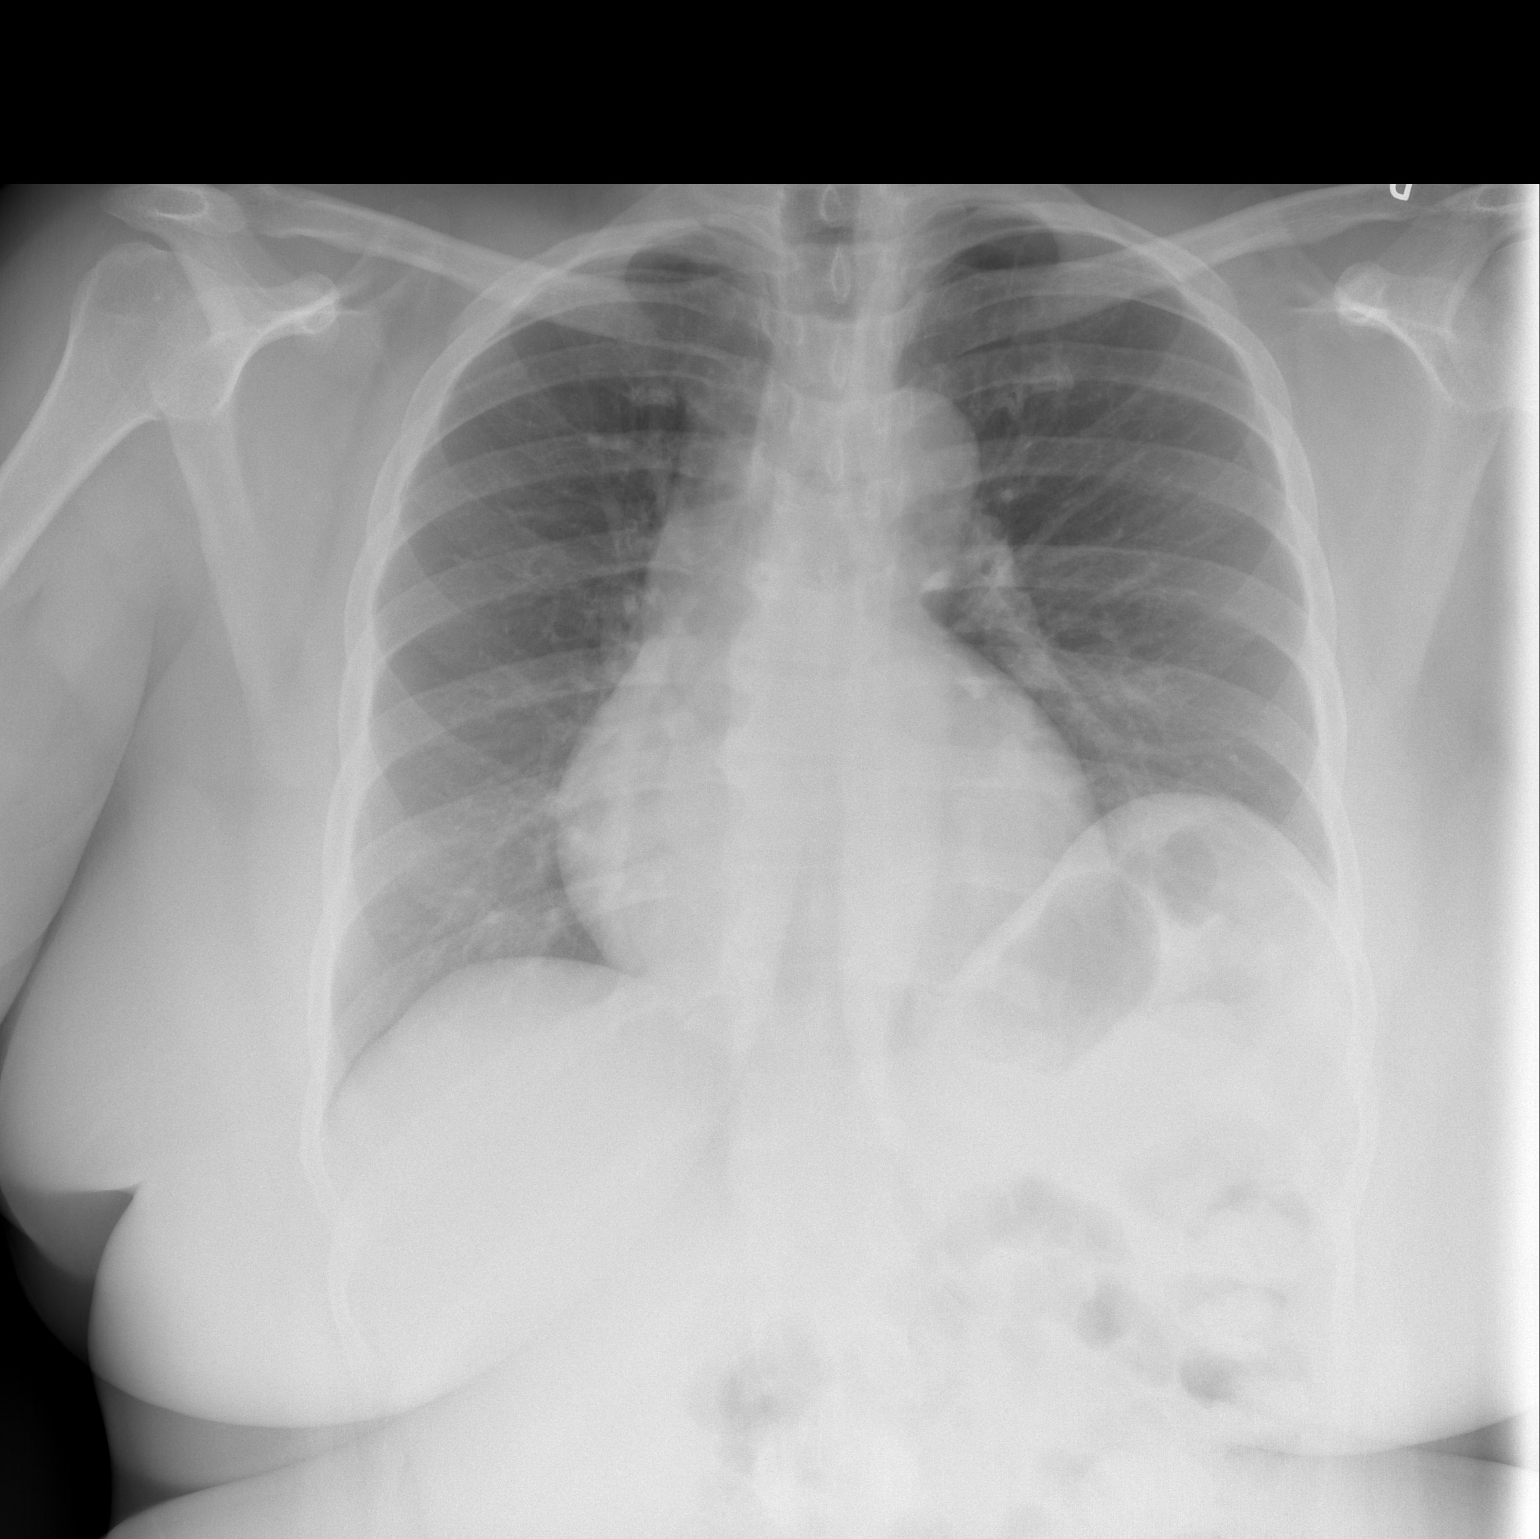

[w chest lat]
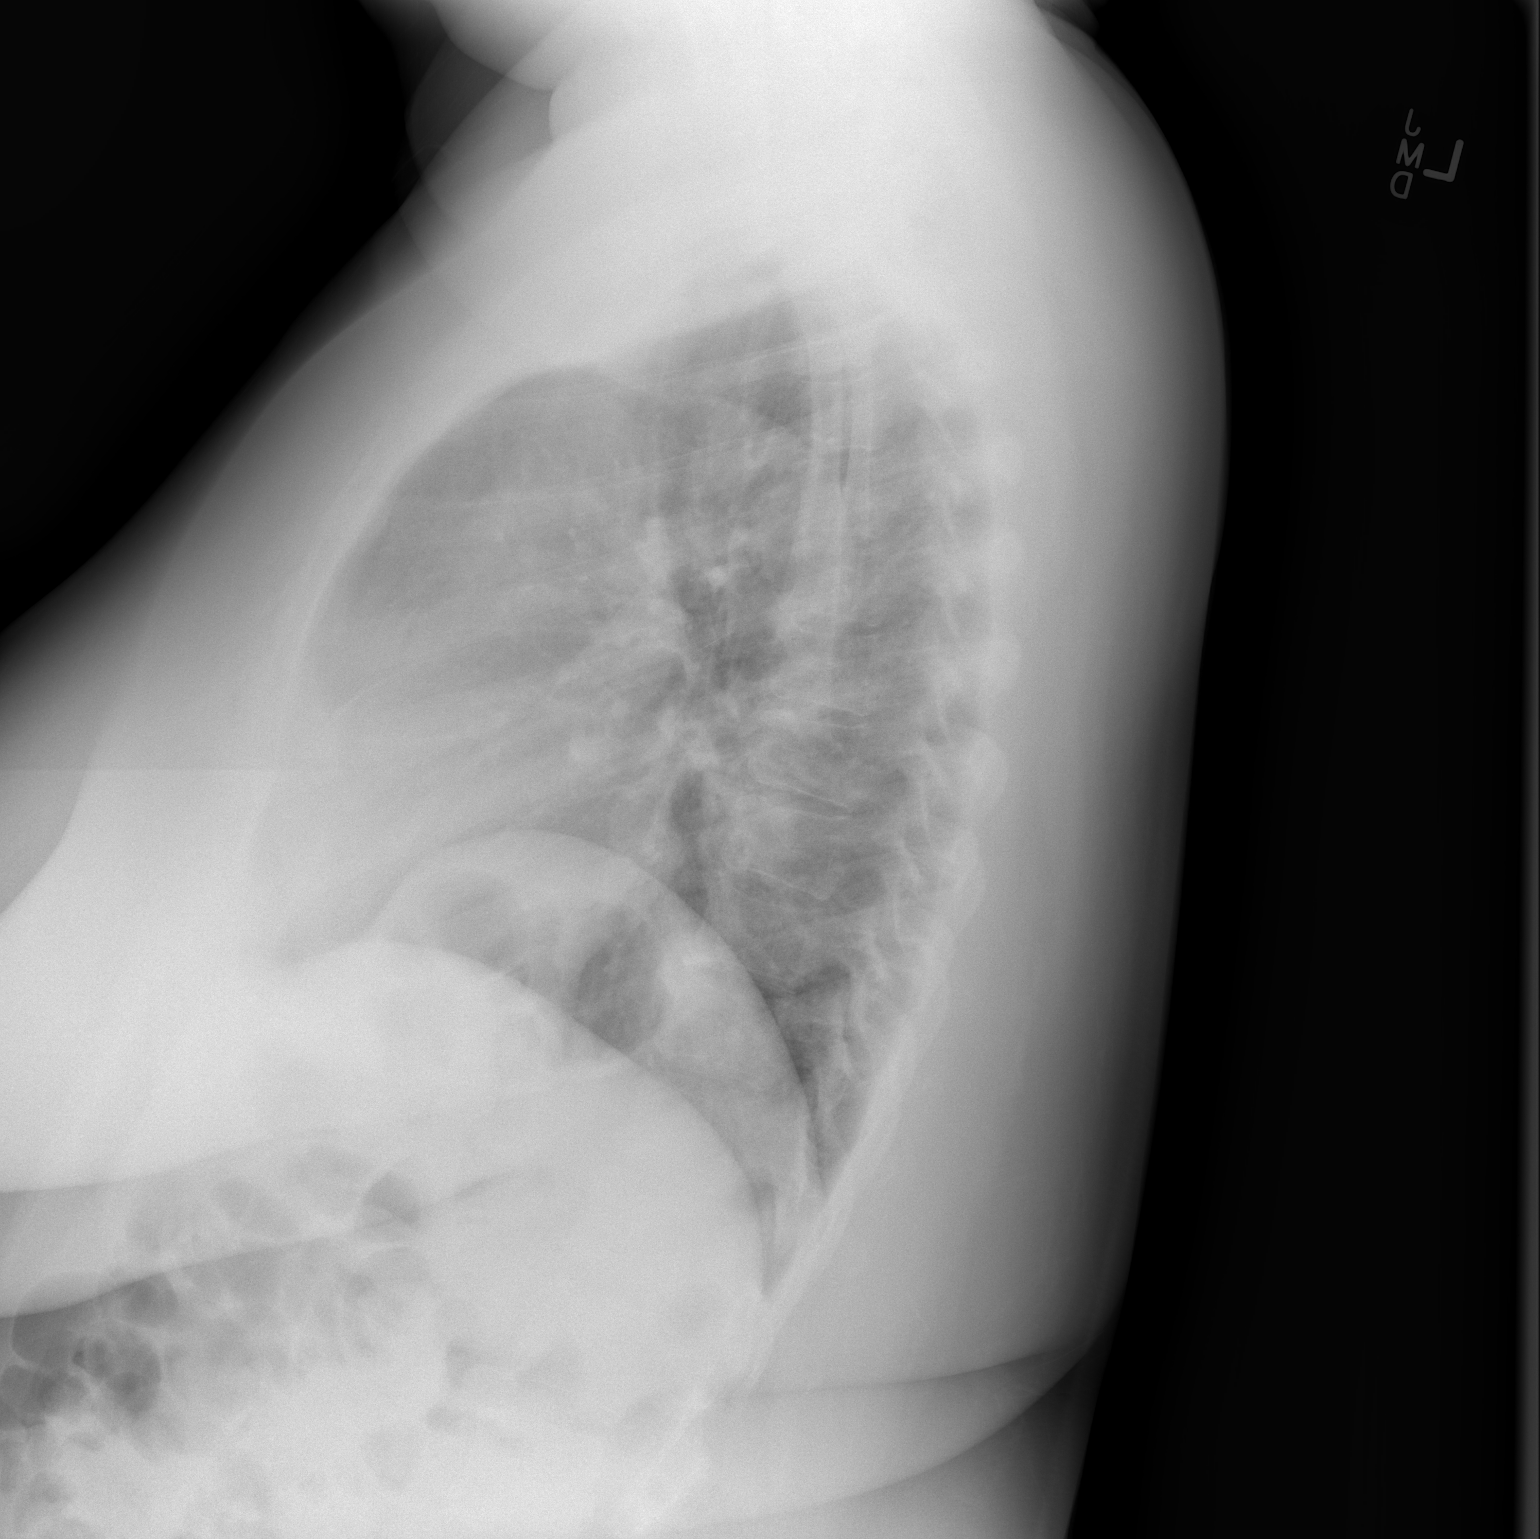

[2 of 2 positions shown; findings below may reference images not displayed]

FINDINGS: Cardiac shadow is stable.  The lungs are clear.  No acute
bony abnormality is seen.
IMPRESSION: No acute abnormality noted.

## 2014-06-01 ENCOUNTER — Emergency Department (HOSPITAL_COMMUNITY)
Admission: EM | Admit: 2014-06-01 | Discharge: 2014-06-01 | Disposition: A | Discharge status: Home / Self Care | Payer: Self-pay | Attending: Emergency Medicine | Admitting: Emergency Medicine

## 2014-06-01 ENCOUNTER — Encounter (HOSPITAL_COMMUNITY): Payer: Self-pay | Admitting: Emergency Medicine

## 2014-06-01 DIAGNOSIS — Z79899 Other long term (current) drug therapy: Secondary | ICD-10-CM | POA: Insufficient documentation

## 2014-06-01 DIAGNOSIS — H6002 Abscess of left external ear: Secondary | ICD-10-CM | POA: Insufficient documentation

## 2014-06-01 DIAGNOSIS — I10 Essential (primary) hypertension: Secondary | ICD-10-CM | POA: Insufficient documentation

## 2014-06-01 DIAGNOSIS — Z8541 Personal history of malignant neoplasm of cervix uteri: Secondary | ICD-10-CM | POA: Insufficient documentation

## 2014-06-01 DIAGNOSIS — J45909 Unspecified asthma, uncomplicated: Secondary | ICD-10-CM | POA: Insufficient documentation

## 2014-06-01 MED ORDER — HYDROCODONE-ACETAMINOPHEN 5-325 MG PO TABS: 1.0000 | ORAL_TABLET | ORAL | Status: AC | PRN

## 2014-06-01 MED ORDER — SULFAMETHOXAZOLE-TRIMETHOPRIM 800-160 MG PO TABS
1.0000 | ORAL_TABLET | Freq: Once | ORAL | Status: AC
  Administered 2014-06-01: 1 via ORAL
  Filled 2014-06-01: qty 1

## 2014-06-01 MED ORDER — SULFAMETHOXAZOLE-TRIMETHOPRIM 800-160 MG PO TABS: 1.0000 | ORAL_TABLET | Freq: Two times a day (BID) | ORAL | Status: AC

## 2014-06-01 NOTE — ED Provider Notes (Signed)
CSN: [id number]     Arrival date & time 06/01/14  4098 History  This chart was scribed for Charlann Lange, PA-C with Arbie Cookey, MD by Edison Simon, ED Scribe. This patient was seen in room WTR6/WTR6 and the patient's care was started at 8:36 PM.    Chief Complaint  Patient presents with  . Ear Problem   The history is provided by the patient. No language interpreter was used.    HPI Comments: Dawn Mitchell is a 44 y.o. female who presents to the Emergency Department complaining of pain and swelling to her external left ear with onset a few days ago. She states began ate site of an old piercing to her pinna. She denies recent piercing. She denies fever or internal ear pain.  Past Medical History  Diagnosis Date  . Hypertension   . Asthma   . Shortness of breath   . Cancer 2004    "abnormal cervical cells"   Past Surgical History  Procedure Laterality Date  . Tubal ligation    . Cesarean section  1993  . Laparoscopic cholecystectomy single port N/A 10/01/2012    Procedure: LAPAROSCOPIC CHOLECYSTECTOMY SINGLE PORT;  Surgeon: Adin Hector, MD;  Location: [facility];  Service: General;  Laterality: N/A;  . Intraoperative cholangiogram N/A 10/01/2012    Procedure: INTRAOPERATIVE CHOLANGIOGRAM;  Surgeon: Adin Hector, MD;  Location: [facility];  Service: General;  Laterality: N/A;  . Cholecystectomy     Family History  Problem Relation Age of Onset  . Diabetes Mother   . Heart attack Mother   . Stroke Mother   . Hypertension Mother   . Hypertension Father   . Hypertension Sister   . Stroke Sister   . Cancer Paternal Grandmother     breast  . Hypertension Sister    History  Substance Use Topics  . Smoking status: Never Smoker   . Smokeless tobacco: Never Used  . Alcohol Use: No   OB History    No data available     Review of Systems  Constitutional: Negative for fever.  HENT: Positive for ear pain.       Allergies  Review of patient's allergies indicates  no known allergies.  Home Medications   Prior to Admission medications   Medication Sig Start Date End Date Taking? Authorizing Provider  hydrochlorothiazide (HYDRODIURIL) 25 MG tablet Take 25 mg by mouth daily.    Historical Provider, MD  Multiple Vitamins-Minerals (MULTIVITAMIN WITH MINERALS) tablet Take 1 tablet by mouth daily.    Historical Provider, MD  pantoprazole (PROTONIX) 40 MG tablet Take 1 tablet (40 mg total) by mouth daily. 09/04/12   Saddie Benders. Ghim, MD   BP 149/92 mmHg  Pulse 72  Temp(Src) 98.2 F (36.8 C) (Oral)  Resp 16  SpO2 100% Physical Exam  Constitutional: She is oriented to person, place, and time. She appears well-developed and well-nourished.  HENT:  Head: Normocephalic and atraumatic.  Right Ear: External ear normal.  swollen external left ear with purulent drainage from posterior piercing site along the upper border  Eyes: Conjunctivae are normal.  Neck: Normal range of motion. Neck supple.  Pulmonary/Chest: Effort normal.  Musculoskeletal: Normal range of motion.  Neurological: She is alert and oriented to person, place, and time.  Skin: Skin is warm and dry.  Psychiatric: She has a normal mood and affect.  Nursing note and vitals reviewed.   ED Course  Procedures (including critical care time)  DIAGNOSTIC STUDIES: Oxygen  Saturation is 100% on room air, normal by my interpretation.    COORDINATION OF CARE: 8:07 PM Discussed treatment plan with patient at beside, the patient agrees with the plan and has no further questions at this time.   Labs Review Labs Reviewed - No data to display  Imaging Review No results found.  No results found.  MDM   Final diagnoses:  None    1. Abscess external ear  Afebrile abscess limited to ear pinna with limited surrounding cellulitic changes. Actively draining - no further I&D required. Abx, pain control and 2 day follow up recommended. Stable for discharge.   I personally performed the services  described in this documentation, which was scribed in my presence. The recorded information has been reviewed and is accurate.     Dewaine Oats, PA-C 06/01/14 2052  Houston Siren III, MD 06/01/14 [phone]

## 2014-06-01 NOTE — ED Notes (Signed)
Pt c/o lt outer ear swelling and pain since Monday.

## 2014-06-01 NOTE — Discharge Instructions (Signed)
Abscess An abscess is an infected area that contains a collection of pus and debris.It can occur in almost any part of the body. An abscess is also known as a furuncle or boil. CAUSES  An abscess occurs when tissue gets infected. This can occur from blockage of oil or sweat glands, infection of hair follicles, or a minor injury to the skin. As the body tries to fight the infection, pus collects in the area and creates pressure under the skin. This pressure causes pain. People with weakened immune systems have difficulty fighting infections and get certain abscesses more often.  SYMPTOMS Usually an abscess develops on the skin and becomes a painful mass that is red, warm, and tender. If the abscess forms under the skin, you may feel a moveable soft area under the skin. Some abscesses break open (rupture) on their own, but most will continue to get worse without care. The infection can spread deeper into the body and eventually into the bloodstream, causing you to feel ill.  DIAGNOSIS  Your caregiver will take your medical history and perform a physical exam. A sample of fluid may also be taken from the abscess to determine what is causing your infection. TREATMENT  Your caregiver may prescribe antibiotic medicines to fight the infection. However, taking antibiotics alone usually does not cure an abscess. Your caregiver may need to make a small cut (incision) in the abscess to drain the pus. In some cases, gauze is packed into the abscess to reduce pain and to continue draining the area. HOME CARE INSTRUCTIONS   Only take over-the-counter or prescription medicines for pain, discomfort, or fever as directed by your caregiver.  If you were prescribed antibiotics, take them as directed. Finish them even if you start to feel better.  If gauze is used, follow your caregiver's directions for changing the gauze.  To avoid spreading the infection:  Keep your draining abscess covered with a  bandage.  Wash your hands well.  Do not share personal care items, towels, or whirlpools with others.  Avoid skin contact with others.  Keep your skin and clothes clean around the abscess.  Keep all follow-up appointments as directed by your caregiver. SEEK MEDICAL CARE IF:   You have increased pain, swelling, redness, fluid drainage, or bleeding.  You have muscle aches, chills, or a general ill feeling.  You have a fever. MAKE SURE YOU:   Understand these instructions.  Will watch your condition.  Will get help right away if you are not doing well or get worse. Document Released: 02/12/2005 Document Revised: 11/04/2011 Document Reviewed: 07/18/2011 [facility] Patient Information 2015 [place], [place]. This information is not intended to replace advice given to you by your health care provider. Make sure you discuss any questions you have with your health care provider. Heat Therapy Heat therapy can help ease sore, stiff, injured, and tight muscles and joints. Heat relaxes your muscles, which may help ease your pain.  RISKS AND COMPLICATIONS If you have any of the following conditions, do not use heat therapy unless your health care provider has approved:  Poor circulation.  Healing wounds or scarred skin in the area being treated.  Diabetes, heart disease, or high blood pressure.  Not being able to feel (numbness) the area being treated.  Unusual swelling of the area being treated.  Active infections.  Blood clots.  Cancer.  Inability to communicate pain. This may include young children and people who have problems with their brain function (dementia).  Pregnancy.  Heat therapy should only be used on old, pre-existing, or long-lasting (chronic) injuries. Do not use heat therapy on new injuries unless directed by your health care provider. HOW TO USE HEAT THERAPY There are several different kinds of heat therapy, including:  Moist heat pack.  Warm water  bath.  Hot water bottle.  Electric heating pad.  Heated gel pack.  Heated wrap.  Electric heating pad. Use the heat therapy method suggested by your health care provider. Follow your health care provider's instructions on when and how to use heat therapy. GENERAL HEAT THERAPY RECOMMENDATIONS  Do not sleep while using heat therapy. Only use heat therapy while you are awake.  Your skin may turn pink while using heat therapy. Do not use heat therapy if your skin turns red.  Do not use heat therapy if you have new pain.  High heat or long exposure to heat can cause burns. Be careful when using heat therapy to avoid burning your skin.  Do not use heat therapy on areas of your skin that are already irritated, such as with a rash or sunburn. SEEK MEDICAL CARE IF:  You have blisters, redness, swelling, or numbness.  You have new pain.  Your pain is worse. MAKE SURE YOU:  Understand these instructions.  Will watch your condition.  Will get help right away if you are not doing well or get worse. Document Released: 07/28/2011 Document Revised: 09/19/2013 Document Reviewed: 06/28/2013 [facility] Patient Information 2015 [place], [place]. This information is not intended to replace advice given to you by your health care provider. Make sure you discuss any questions you have with your health care provider.

## 2014-11-24 ENCOUNTER — Emergency Department (HOSPITAL_COMMUNITY)
Admission: EM | Admit: 2014-11-24 | Discharge: 2014-11-24 | Disposition: A | Discharge status: Home / Self Care | Payer: 59 | Attending: Emergency Medicine | Admitting: Emergency Medicine

## 2014-11-24 ENCOUNTER — Encounter (HOSPITAL_COMMUNITY): Payer: Self-pay | Admitting: Emergency Medicine

## 2014-11-24 DIAGNOSIS — J069 Acute upper respiratory infection, unspecified: Secondary | ICD-10-CM | POA: Diagnosis not present

## 2014-11-24 DIAGNOSIS — J45909 Unspecified asthma, uncomplicated: Secondary | ICD-10-CM | POA: Insufficient documentation

## 2014-11-24 DIAGNOSIS — Z79899 Other long term (current) drug therapy: Secondary | ICD-10-CM | POA: Diagnosis not present

## 2014-11-24 DIAGNOSIS — H6502 Acute serous otitis media, left ear: Secondary | ICD-10-CM | POA: Diagnosis not present

## 2014-11-24 DIAGNOSIS — Z8541 Personal history of malignant neoplasm of cervix uteri: Secondary | ICD-10-CM | POA: Insufficient documentation

## 2014-11-24 DIAGNOSIS — H9202 Otalgia, left ear: Secondary | ICD-10-CM | POA: Diagnosis present

## 2014-11-24 DIAGNOSIS — I10 Essential (primary) hypertension: Secondary | ICD-10-CM | POA: Diagnosis not present

## 2014-11-24 MED ORDER — PSEUDOEPHEDRINE HCL 60 MG PO TABS: 60.0000 mg | ORAL_TABLET | Freq: Four times a day (QID) | ORAL | Status: AC | PRN

## 2014-11-24 MED ORDER — IBUPROFEN 800 MG PO TABS: 800.0000 mg | ORAL_TABLET | Freq: Three times a day (TID) | ORAL | Status: AC | PRN

## 2014-11-24 NOTE — Discharge Instructions (Signed)
Read the information below.  Use the prescribed medication as directed.  Please discuss all new medications with your pharmacist.  You may return to the Emergency Department at any time for worsening condition or any new symptoms that concern you.    If you develop fevers, uncontrolled ear pain, bleeding or discharge from your ear, see your doctor or return for a recheck.    If you develop high fevers that do not resolve with tylenol or ibuprofen, you have difficulty swallowing or breathing, or you are unable to tolerate fluids by mouth, return to the ER for a recheck.      Serous Otitis Media Serous otitis media is fluid in the middle ear space. This space contains the bones for hearing and air. Air in the middle ear space helps to transmit sound.  The air gets there through the eustachian tube. This tube goes from the back of the nose (nasopharynx) to the middle ear space. It keeps the pressure in the middle ear the same as the outside world. It also helps to drain fluid from the middle ear space. CAUSES  Serous otitis media occurs when the eustachian tube gets blocked. Blockage can come from:  Ear infections.  Colds and other upper respiratory infections.  Allergies.  Irritants such as cigarette smoke.  Sudden changes in air pressure (such as descending in an airplane).  Enlarged adenoids.  A mass in the nasopharynx. During colds and upper respiratory infections, the middle ear space can become temporarily filled with fluid. This can happen after an ear infection also. Once the infection clears, the fluid will generally drain out of the ear through the eustachian tube. If it does not, then serous otitis media occurs. SIGNS AND SYMPTOMS   Hearing loss.  A feeling of fullness in the ear, without pain.  Young children may not show any symptoms but may show slight behavioral changes, such as agitation, ear pulling, or crying. DIAGNOSIS  Serous otitis media is diagnosed by an ear exam.  Tests may be done to check on the movement of the eardrum. Hearing exams may also be done. TREATMENT  The fluid most often goes away without treatment. If allergy is the cause, allergy treatment may be helpful. Fluid that persists for several months may require minor surgery. A small tube is placed in the eardrum to:  Drain the fluid.  Restore the air in the middle ear space. In certain situations, antibiotic medicines are used to avoid surgery. Surgery may be done to remove enlarged adenoids (if this is the cause). HOME CARE INSTRUCTIONS   Keep children away from tobacco smoke.  Keep all follow-up visits as directed by your health care provider. SEEK MEDICAL CARE IF:   Your hearing is not better in 3 months.  Your hearing is worse.  You have ear pain.  You have drainage from the ear.  You have dizziness.  You have serous otitis media only in one ear or have any bleeding from your nose (epistaxis).  You notice a lump on your neck. MAKE SURE YOU:  Understand these instructions.   Will watch your condition.   Will get help right away if you are not doing well or get worse.  Document Released: 07/26/2003 Document Revised: 09/19/2013 Document Reviewed: 11/30/2012 [facility] Patient Information 2015 [place], [place]. This information is not intended to replace advice given to you by your health care provider. Make sure you discuss any questions you have with your health care provider.  Upper Respiratory Infection, Adult  An upper respiratory infection (URI) is also sometimes known as the common cold. The upper respiratory tract includes the nose, sinuses, throat, trachea, and bronchi. Bronchi are the airways leading to the lungs. Most people improve within 1 week, but symptoms can last up to 2 weeks. A residual cough may last even longer.  CAUSES Many different viruses can infect the tissues lining the upper respiratory tract. The tissues become irritated and inflamed and often  become very moist. Mucus production is also common. A cold is contagious. You can easily spread the virus to others by oral contact. This includes kissing, sharing a glass, coughing, or sneezing. Touching your mouth or nose and then touching a surface, which is then touched by another person, can also spread the virus. SYMPTOMS  Symptoms typically develop 1 to 3 days after you come in contact with a cold virus. Symptoms vary from person to person. They may include:  Runny nose.  Sneezing.  Nasal congestion.  Sinus irritation.  Sore throat.  Loss of voice (laryngitis).  Cough.  Fatigue.  Muscle aches.  Loss of appetite.  Headache.  Low-grade fever. DIAGNOSIS  You might diagnose your own cold based on familiar symptoms, since most people get a cold 2 to 3 times a year. Your caregiver can confirm this based on your exam. Most importantly, your caregiver can check that your symptoms are not due to another disease such as strep throat, sinusitis, pneumonia, asthma, or epiglottitis. Blood tests, throat tests, and X-rays are not necessary to diagnose a common cold, but they may sometimes be helpful in excluding other more serious diseases. Your caregiver will decide if any further tests are required. RISKS AND COMPLICATIONS  You may be at risk for a more severe case of the common cold if you smoke cigarettes, have chronic heart disease (such as heart failure) or lung disease (such as asthma), or if you have a weakened immune system. The very young and very old are also at risk for more serious infections. Bacterial sinusitis, middle ear infections, and bacterial pneumonia can complicate the common cold. The common cold can worsen asthma and chronic obstructive pulmonary disease (COPD). Sometimes, these complications can require emergency medical care and may be life-threatening. PREVENTION  The best way to protect against getting a cold is to practice good hygiene. Avoid oral or hand contact  with people with cold symptoms. Wash your hands often if contact occurs. There is no clear evidence that vitamin C, vitamin E, echinacea, or exercise reduces the chance of developing a cold. However, it is always recommended to get plenty of rest and practice good nutrition. TREATMENT  Treatment is directed at relieving symptoms. There is no cure. Antibiotics are not effective, because the infection is caused by a virus, not by bacteria. Treatment may include:  Increased fluid intake. Sports drinks offer valuable electrolytes, sugars, and fluids.  Breathing heated mist or steam (vaporizer or shower).  Eating chicken soup or other clear broths, and maintaining good nutrition.  Getting plenty of rest.  Using gargles or lozenges for comfort.  Controlling fevers with ibuprofen or acetaminophen as directed by your caregiver.  Increasing usage of your inhaler if you have asthma. Zinc gel and zinc lozenges, taken in the first 24 hours of the common cold, can shorten the duration and lessen the severity of symptoms. Pain medicines may help with fever, muscle aches, and throat pain. A variety of non-prescription medicines are available to treat congestion and runny nose. Your caregiver can make recommendations  and may suggest nasal or lung inhalers for other symptoms.  HOME CARE INSTRUCTIONS   Only take over-the-counter or prescription medicines for pain, discomfort, or fever as directed by your caregiver.  Use a warm mist humidifier or inhale steam from a shower to increase air moisture. This may keep secretions moist and make it easier to breathe.  Drink enough water and fluids to keep your urine clear or pale yellow.  Rest as needed.  Return to work when your temperature has returned to normal or as your caregiver advises. You may need to stay home longer to avoid infecting others. You can also use a face mask and careful hand washing to prevent spread of the virus. SEEK MEDICAL CARE IF:    After the first few days, you feel you are getting worse rather than better.  You need your caregiver's advice about medicines to control symptoms.  You develop chills, worsening shortness of breath, or brown or red sputum. These may be signs of pneumonia.  You develop yellow or brown nasal discharge or pain in the face, especially when you bend forward. These may be signs of sinusitis.  You develop a fever, swollen neck glands, pain with swallowing, or white areas in the back of your throat. These may be signs of strep throat. SEEK IMMEDIATE MEDICAL CARE IF:   You have a fever.  You develop severe or persistent headache, ear pain, sinus pain, or chest pain.  You develop wheezing, a prolonged cough, cough up blood, or have a change in your usual mucus (if you have chronic lung disease).  You develop sore muscles or a stiff neck. Document Released: 10/29/2000 Document Revised: 07/28/2011 Document Reviewed: 08/10/2013 [facility] Patient Information 2015 [place], [place]. This information is not intended to replace advice given to you by your health care provider. Make sure you discuss any questions you have with your health care provider.

## 2014-11-24 NOTE — ED Notes (Signed)
Patient here with complaint of sore throat and left ear pain. States it began about 6 days ago. Initially only a sore throat progressed to loss of voice, and now just ear pain and throat pain. Denies fever, neck pain.

## 2014-11-24 NOTE — ED Provider Notes (Signed)
CSN: [id number]     Arrival date & time 11/24/14  2145 History  This chart was scribed for non-physician practitioner, Clayton Bibles, PA-C, working with Ernestina Patches, MD, by Helane Gunther ED Scribe. This patient was seen in room TR06C/TR06C and the patient's care was started at 10:23 PM    Chief Complaint  Patient presents with  . URI  . Otalgia   Patient is a 44 y.o. female presenting with ear pain. The history is provided by the patient. No language interpreter was used.  Otalgia Associated symptoms: cough, rhinorrhea and sore throat   Associated symptoms: no ear discharge, no fever and no rash    HPI Comments: Dawn Mitchell is a 44 y.o. female who presents to the Emergency Department complaining of a sore throat and constant, aching left ear pain onset 6 days ago and today, respectively. Pt states her current condition started as a cough. She reports associated nosebleeds and rhinorhea. She states that she has overall improved until her ear pain began today. She has been taking rhobitussin and nyquil with mild relief. She has a PMHx of HTN, and is on an antidepressant and an iron pill. Pt denies SOB, hemoptysis, ear discharge, and fever, chills, myalgias.  Past Medical History  Diagnosis Date  . Hypertension   . Asthma   . Shortness of breath   . Cancer 2004    "abnormal cervical cells"   Past Surgical History  Procedure Laterality Date  . Tubal ligation    . Cesarean section  1993  . Laparoscopic cholecystectomy single port N/A 10/01/2012    Procedure: LAPAROSCOPIC CHOLECYSTECTOMY SINGLE PORT;  Surgeon: Adin Hector, MD;  Location: [facility];  Service: General;  Laterality: N/A;  . Intraoperative cholangiogram N/A 10/01/2012    Procedure: INTRAOPERATIVE CHOLANGIOGRAM;  Surgeon: Adin Hector, MD;  Location: [facility];  Service: General;  Laterality: N/A;  . Cholecystectomy     Family History  Problem Relation Age of Onset  . Diabetes Mother   . Heart attack Mother   . Stroke  Mother   . Hypertension Mother   . Hypertension Father   . Hypertension Sister   . Stroke Sister   . Cancer Paternal Grandmother     breast  . Hypertension Sister    History  Substance Use Topics  . Smoking status: Never Smoker   . Smokeless tobacco: Never Used  . Alcohol Use: No   OB History    No data available     Review of Systems  Constitutional: Negative for fever.  HENT: Positive for ear pain, nosebleeds, rhinorrhea, sinus pressure and sore throat. Negative for ear discharge and trouble swallowing.   Respiratory: Positive for cough. Negative for shortness of breath.   Musculoskeletal: Negative for myalgias.  Skin: Negative for rash.  Allergic/Immunologic: Negative for immunocompromised state.    Allergies  Review of patient's allergies indicates no known allergies.  Home Medications   Prior to Admission medications   Medication Sig Start Date End Date Taking? Authorizing Provider  hydrochlorothiazide (HYDRODIURIL) 25 MG tablet Take 25 mg by mouth daily.    Historical Provider, MD  HYDROcodone-acetaminophen (NORCO/VICODIN) 5-325 MG per tablet Take 1-2 tablets by mouth every 4 (four) hours as needed. 06/01/14   Charlann Lange, PA-C  Multiple Vitamins-Minerals (MULTIVITAMIN WITH MINERALS) tablet Take 1 tablet by mouth daily.    Historical Provider, MD  pantoprazole (PROTONIX) 40 MG tablet Take 1 tablet (40 mg total) by mouth daily. 09/04/12   Kingsley Spittle, MD  sulfamethoxazole-trimethoprim (SEPTRA DS) 800-160 MG per tablet Take 1 tablet by mouth every 12 (twelve) hours. 06/01/14   Shari Upstill, PA-C   BP 150/92 mmHg  Pulse 94  Temp(Src) 99.2 F (37.3 C) (Oral)  Resp 18  Wt 280 lb 14.4 oz (127.415 kg)  SpO2 95%  LMP 09/20/2014 (Approximate) Physical Exam  Constitutional: She appears well-developed and well-nourished. No distress.  HENT:  Head: Normocephalic and atraumatic.  Mouth/Throat: Oropharynx is clear and moist. No oropharyngeal exudate.  Erythema, no  tonsillar exudate or edema Right TM and canal are normal Left ear no pain with traction of the pinna, no mastoid tenderness.  Bulging left TM, but clear. Erythema and edema of the nasal mucosa No tenderness of the frontal and maxillary sinuses  Eyes: Conjunctivae are normal.  Neck: Neck supple.  Cardiovascular: Normal rate and regular rhythm.   Pulmonary/Chest: Effort normal and breath sounds normal. No respiratory distress. She has no wheezes. She has no rales.  Neurological: She is alert.  Skin: She is not diaphoretic.  Nursing note and vitals reviewed.   ED Course  Procedures  DIAGNOSTIC STUDIES: Oxygen Saturation is 95% on RA, adequate by my interpretation.    COORDINATION OF CARE: 10:27 PM - Discussed plans to order a decongestant and anti-inflammatory medication. Pt advised of plan for treatment and pt agrees.  Labs Review Labs Reviewed - No data to display  Imaging Review No results found.   EKG Interpretation None      MDM   Final diagnoses:  URI (upper respiratory infection)  Acute serous otitis media of left ear, recurrence not specified    Afebrile, nontoxic immunocompetent patient with constellation of symptoms suggestive of viral syndrome x 6 days.  No concerning findings on exam.  Discharged home with supportive care, PCP follow up.  Discussed result, findings, treatment, and follow up  with patient.  Pt given return precautions.  Pt verbalizes understanding and agrees with plan.      I personally performed the services described in this documentation, which was scribed in my presence. The recorded information has been reviewed and is accurate.   Clayton Bibles, PA-C 11/24/14 2252  Ernestina Patches, MD 11/25/14 [phone]

## 2016-06-17 ENCOUNTER — Other Ambulatory Visit: Payer: Self-pay | Admitting: Family Medicine

## 2016-06-17 DIAGNOSIS — Z1231 Encounter for screening mammogram for malignant neoplasm of breast: Secondary | ICD-10-CM

## 2016-07-09 ENCOUNTER — Ambulatory Visit
Admission: RE | Admit: 2016-07-09 | Discharge: 2016-07-09 | Disposition: A | Discharge status: Home / Self Care | Payer: BLUE CROSS/BLUE SHIELD | Source: Ambulatory Visit | Attending: Family Medicine | Admitting: Family Medicine

## 2016-07-09 DIAGNOSIS — Z1231 Encounter for screening mammogram for malignant neoplasm of breast: Secondary | ICD-10-CM

## 2018-01-11 ENCOUNTER — Other Ambulatory Visit: Payer: Self-pay | Admitting: Family Medicine

## 2018-01-11 DIAGNOSIS — Z1231 Encounter for screening mammogram for malignant neoplasm of breast: Secondary | ICD-10-CM

## 2018-02-08 ENCOUNTER — Ambulatory Visit
Admission: RE | Admit: 2018-02-08 | Discharge: 2018-02-08 | Disposition: A | Discharge status: Home / Self Care | Payer: BLUE CROSS/BLUE SHIELD | Source: Ambulatory Visit | Attending: Family Medicine | Admitting: Family Medicine

## 2018-02-08 DIAGNOSIS — Z1231 Encounter for screening mammogram for malignant neoplasm of breast: Secondary | ICD-10-CM

## 2019-01-03 ENCOUNTER — Emergency Department (HOSPITAL_COMMUNITY): Payer: HRSA Program

## 2019-01-03 ENCOUNTER — Encounter (HOSPITAL_COMMUNITY): Payer: Self-pay | Admitting: Emergency Medicine

## 2019-01-03 ENCOUNTER — Other Ambulatory Visit: Payer: Self-pay

## 2019-01-03 ENCOUNTER — Emergency Department (HOSPITAL_COMMUNITY)
Admission: EM | Admit: 2019-01-03 | Discharge: 2019-01-03 | Disposition: A | Discharge status: Home / Self Care | Payer: HRSA Program | Attending: Emergency Medicine | Admitting: Emergency Medicine

## 2019-01-03 DIAGNOSIS — J45909 Unspecified asthma, uncomplicated: Secondary | ICD-10-CM | POA: Insufficient documentation

## 2019-01-03 DIAGNOSIS — J029 Acute pharyngitis, unspecified: Secondary | ICD-10-CM | POA: Diagnosis not present

## 2019-01-03 DIAGNOSIS — U071 COVID-19: Secondary | ICD-10-CM | POA: Insufficient documentation

## 2019-01-03 DIAGNOSIS — I10 Essential (primary) hypertension: Secondary | ICD-10-CM | POA: Diagnosis not present

## 2019-01-03 DIAGNOSIS — Z20828 Contact with and (suspected) exposure to other viral communicable diseases: Secondary | ICD-10-CM

## 2019-01-03 DIAGNOSIS — R05 Cough: Secondary | ICD-10-CM

## 2019-01-03 DIAGNOSIS — Z859 Personal history of malignant neoplasm, unspecified: Secondary | ICD-10-CM | POA: Insufficient documentation

## 2019-01-03 DIAGNOSIS — R059 Cough, unspecified: Secondary | ICD-10-CM

## 2019-01-03 DIAGNOSIS — Z79899 Other long term (current) drug therapy: Secondary | ICD-10-CM | POA: Diagnosis not present

## 2019-01-03 DIAGNOSIS — Z20822 Contact with and (suspected) exposure to covid-19: Secondary | ICD-10-CM

## 2019-01-03 LAB — SARS CORONAVIRUS 2 (TAT 6-24 HRS): SARS Coronavirus 2: POSITIVE — AB

## 2019-01-03 NOTE — ED Triage Notes (Signed)
Patient states husband tested positive for Covid this morning. Patient requesting testing due to cough and sore throat since Thursday. Patient denies any fever or shortness of breath.

## 2019-01-03 NOTE — Discharge Instructions (Signed)
You may not return to work until 2 weeks after symptoms have resolved.  You need to be cleared by your doctor and follow urine player's instructions. Follow all isolation precautions in your discharge instructions.

## 2019-01-03 NOTE — ED Provider Notes (Signed)
[facility] EMERGENCY DEPARTMENT Provider Note   CSN: [id number] Arrival date & time: 01/03/19  1031     History   Chief Complaint Chief Complaint  Patient presents with  . Cough  . Sore Throat    HPI Dawn Mitchell is a 48 y.o. female.     HPI Patient has not had a cough for the past 4 days.  He denies any chest pain or shortness of breath.  She has not had a fever.  She reports she otherwise feels fine.  No vomiting no diarrhea.  Her husband was tested this morning and tested positive.  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  He has hypertension but no other medical problems. Past Medical History:  Diagnosis Date  . Asthma   . Cancer [facility]) 2004   "abnormal cervical cells"  . Hypertension   . Shortness of breath     Patient Active Problem List   Diagnosis Date Noted  . Chronic cholecystitis with calculus 09/22/2012  . Steatohepatitis, nonalcoholic 83/01/4075  . Obesity, Class III, BMI 40-49.9 (morbid obesity) ([place]) 09/22/2012  . Hypertension     Past Surgical History:  Procedure Laterality Date  . CESAREAN SECTION  1993  . CHOLECYSTECTOMY    . INTRAOPERATIVE CHOLANGIOGRAM N/A 10/01/2012   Procedure: INTRAOPERATIVE CHOLANGIOGRAM;  Surgeon: Adin Hector, MD;  Location: [facility];  Service: General;  Laterality: N/A;  . LAPAROSCOPIC CHOLECYSTECTOMY SINGLE PORT N/A 10/01/2012   Procedure: LAPAROSCOPIC CHOLECYSTECTOMY SINGLE PORT;  Surgeon: Adin Hector, MD;  Location: [facility];  Service: General;  Laterality: N/A;  . TUBAL LIGATION       OB History   No obstetric history on file.      Home Medications    Prior to Admission medications   Medication Sig Start Date End Date Taking? Authorizing Provider  hydrochlorothiazide (HYDRODIURIL) 25 MG tablet Take 25 mg by mouth daily.    [provider]  HYDROcodone-acetaminophen (NORCO/VICODIN) 5-325 MG per tablet Take 1-2 tablets by mouth every 4 (four) hours as needed. 06/01/14    Charlann Lange, PA-C  ibuprofen (ADVIL,MOTRIN) 800 MG tablet Take 1 tablet (800 mg total) by mouth every 8 (eight) hours as needed for mild pain or moderate pain. 11/24/14   Clayton Bibles, PA-C  Multiple Vitamins-Minerals (MULTIVITAMIN WITH MINERALS) tablet Take 1 tablet by mouth daily.    [provider]  pantoprazole (PROTONIX) 40 MG tablet Take 1 tablet (40 mg total) by mouth daily. 09/04/12   Kingsley Spittle, MD  pseudoephedrine (SUDAFED) 60 MG tablet Take 1 tablet (60 mg total) by mouth every 6 (six) hours as needed for congestion. 11/24/14   Clayton Bibles, PA-C  sulfamethoxazole-trimethoprim (SEPTRA DS) 800-160 MG per tablet Take 1 tablet by mouth every 12 (twelve) hours. 06/01/14   Charlann Lange, PA-C    Family History Family History  Problem Relation Age of Onset  . Diabetes Mother   . Heart attack Mother   . Stroke Mother   . Hypertension Mother   . Hypertension Father   . Hypertension Sister   . Stroke Sister   . Hypertension Sister   . Cancer Paternal Grandmother        breast  . Breast cancer Maternal Grandmother     Social History Social History   Tobacco Use  . Smoking status: Never Smoker  . Smokeless tobacco: Never Used  Substance Use Topics  . Alcohol use: No  . Drug use: No     Allergies  Patient has no known allergies.   Review of Systems Review of Systems 10 Systems reviewed and are negative for acute change except as noted in the HPI.   Physical Exam Updated Vital Signs BP (!) 142/64 (BP Location: Left Arm)   Pulse 75   Temp 98.7 F (37.1 C) (Oral)   Resp 14   Ht 5\' 7"  (1.702 m)   Wt 132.9 kg   LMP 11/03/2018   SpO2 97%   BMI 45.89 kg/m   Physical Exam Constitutional:      Appearance: Normal appearance. She is well-developed.  HENT:     Head: Normocephalic and atraumatic.  Eyes:     Extraocular Movements: Extraocular movements intact.  Neck:     Musculoskeletal: Neck supple.  Cardiovascular:     Rate and Rhythm: Normal rate and  regular rhythm.     Heart sounds: Normal heart sounds.  Pulmonary:     Effort: Pulmonary effort is normal.     Breath sounds: Normal breath sounds.  Abdominal:     General: Bowel sounds are normal. There is no distension.     Palpations: Abdomen is soft.     Tenderness: There is no abdominal tenderness.  Musculoskeletal: Normal range of motion.  Skin:    General: Skin is warm and dry.  Neurological:     Mental Status: She is alert and oriented to person, place, and time.     GCS: GCS eye subscore is 4. GCS verbal subscore is 5. GCS motor subscore is 6.     Coordination: Coordination normal.      ED Treatments / Results  Labs (all labs ordered are listed, but only abnormal results are displayed) Labs Reviewed - No data to display  EKG None  Radiology No results found.  Procedures Procedures (including critical care time)  Medications Ordered in ED Medications - No data to display   Initial Impression / Assessment and Plan / ED Course  I have reviewed the triage vital signs and the nursing notes.  Pertinent labs & imaging results that were available during my care of the patient were reviewed by me and considered in my medical decision making (see chart for details).       Patient is clinically well in appearance.  She has had positive exposure to coronavirus vis--vis her husband.  At this time her chest x-ray is clear and she has no hypoxia.  Isolation precautions are given.  Turn precautions reviewed.  Patient stable for discharge.  Final Clinical Impressions(s) / ED Diagnoses   Final diagnoses:  Close Exposure to Covid-19 Virus  Cough    ED Discharge Orders    None       Charlesetta Shanks, MD 01/03/19 1242

## 2019-06-20 DEATH — deceased

## 2019-08-18 IMAGING — MG DIGITAL SCREENING BILATERAL MAMMOGRAM WITH TOMO AND CAD
6 of 12 series · 6 of 36 positions shown · non-contrast
Comparison: Previous exam(s).

CLINICAL DATA: Screening.

EXAM:
DIGITAL SCREENING BILATERAL MAMMOGRAM WITH TOMO AND CAD

[L MLO synth-2D]
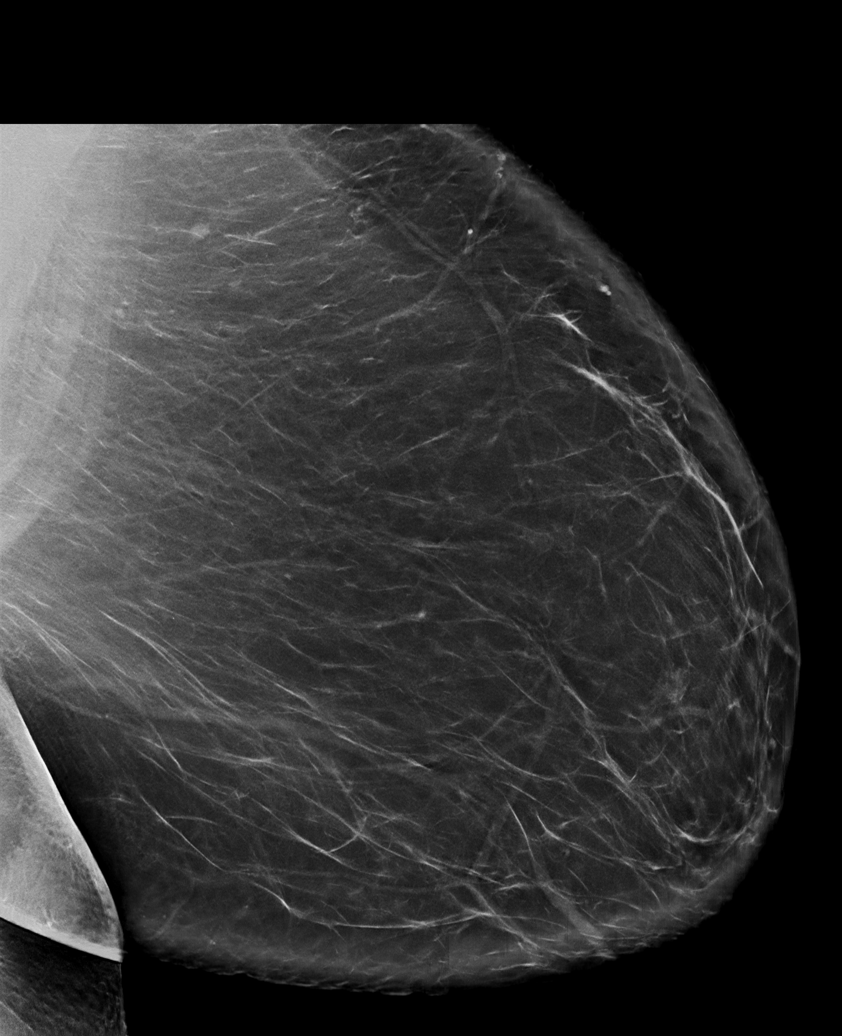

[L CC synth-2D]
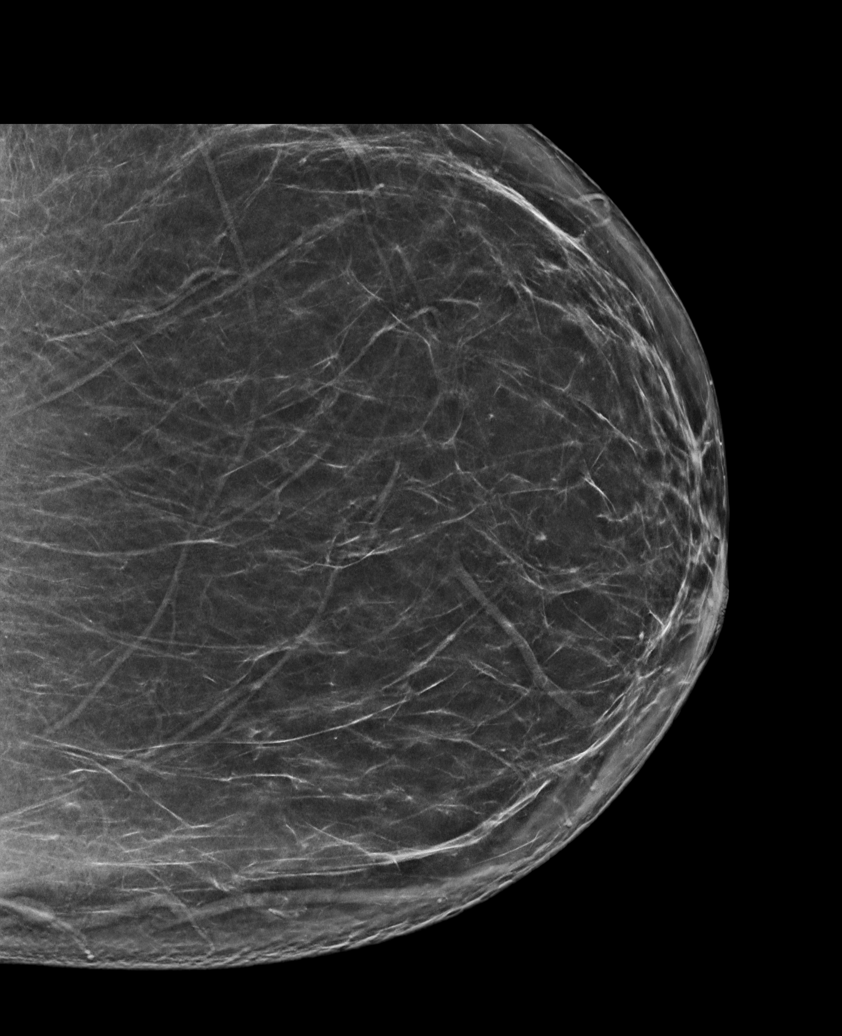

[R MLO synth-2D]
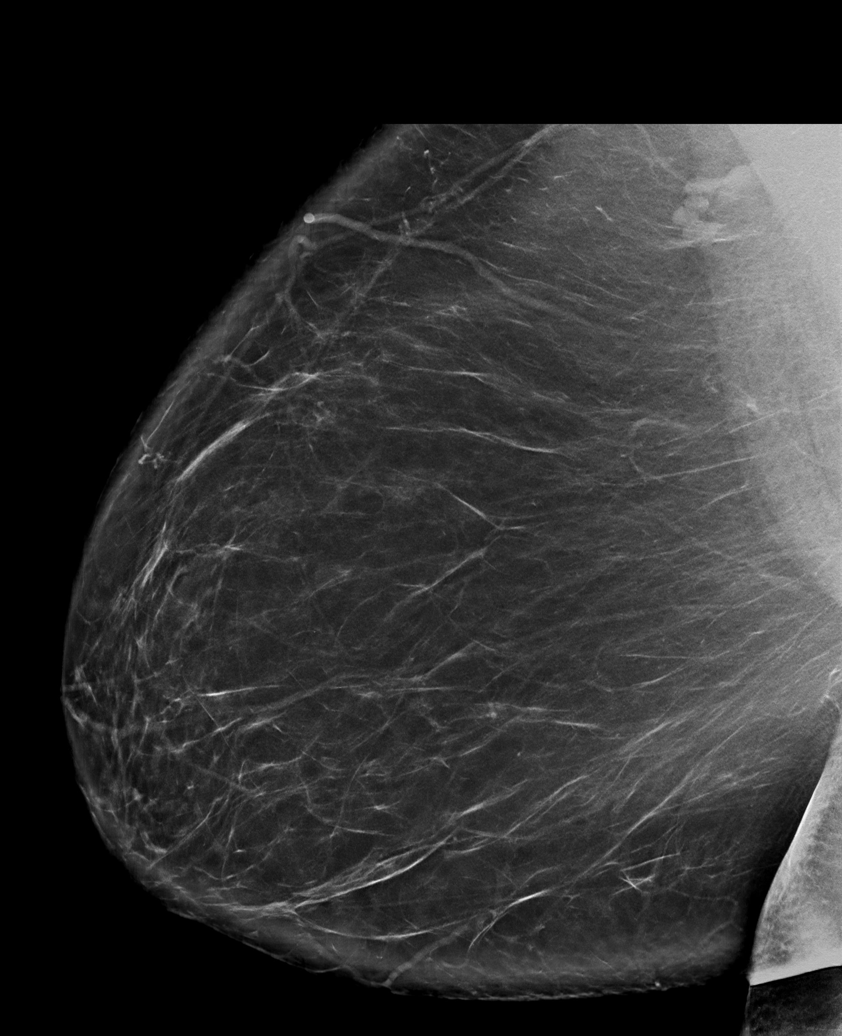

[L XCCL synth-2D]
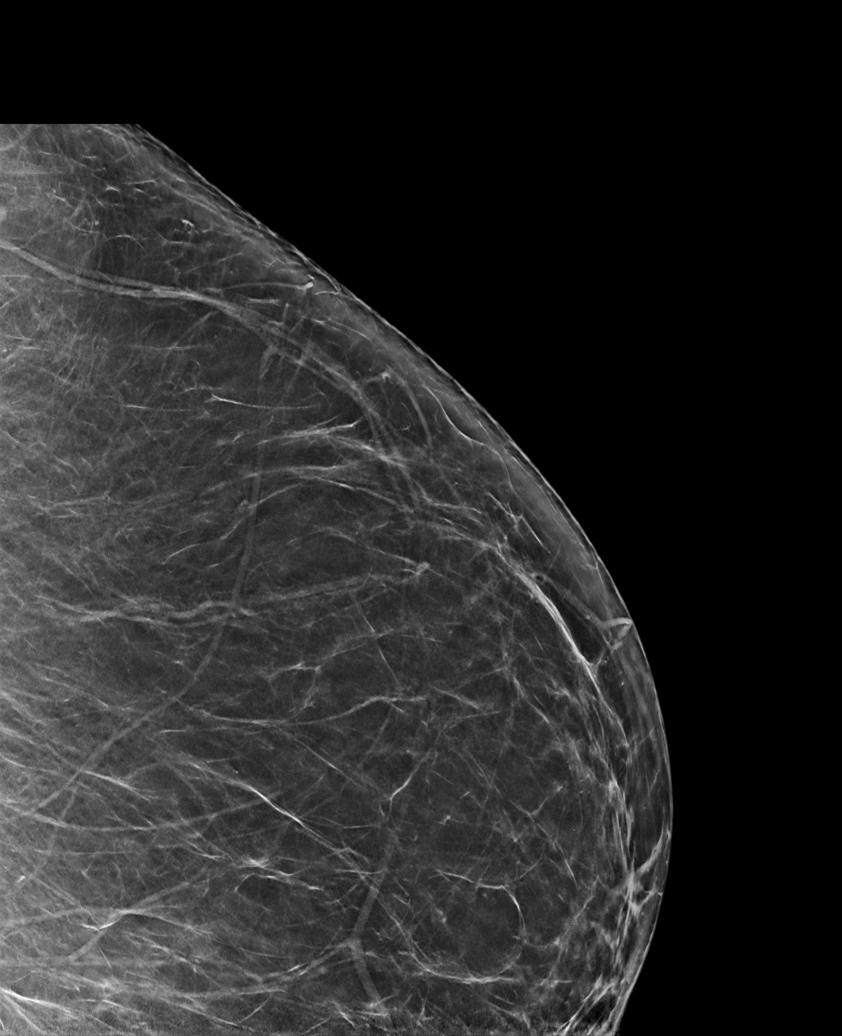

[R CC synth-2D]
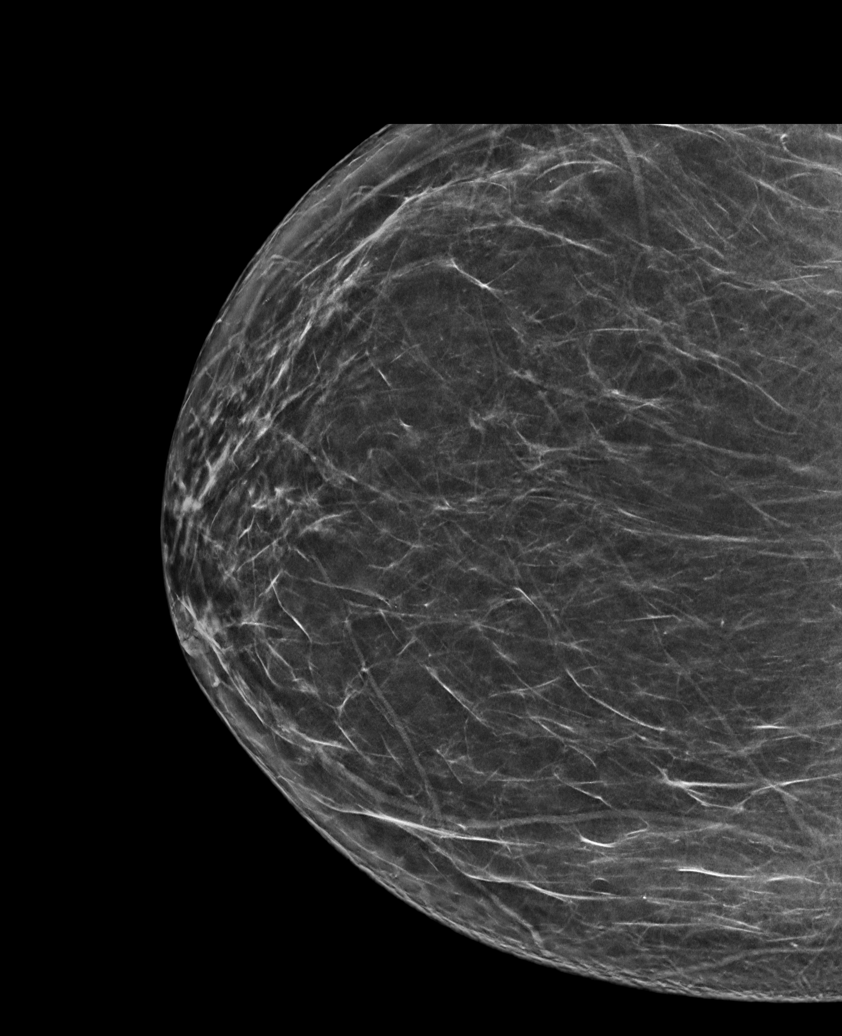

[R XCCL synth-2D]
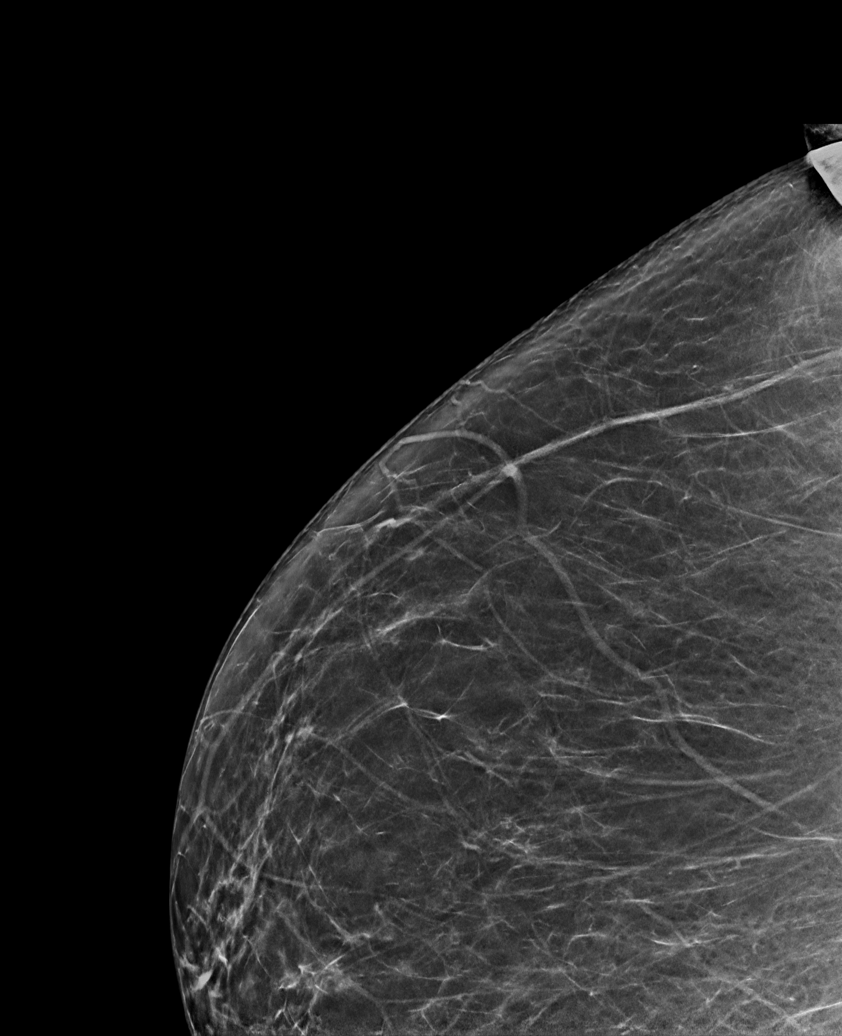

[6 of 36 positions shown; findings below may reference images not displayed]

ACR Breast Density Category b: There are scattered areas of
fibroglandular density.
FINDINGS: There are no findings suspicious for malignancy. Images were
processed with CAD.
IMPRESSION: No mammographic evidence of malignancy. A result letter of this
screening mammogram will be mailed directly to the patient.

RECOMMENDATION:
Screening mammogram in one year. (Code:[id number])

BI-RADS CATEGORY  1: Negative.
